# Patient Record
Sex: Male | Born: 1970 | Race: White | Hispanic: No | Marital: Married | Smoking: Former smoker
Health system: Southern US, Community
[De-identification: ages and names within clinical notes are randomized; demographics above are authoritative.]

## PROBLEM LIST (undated history)

## (undated) DIAGNOSIS — G4730 Sleep apnea, unspecified: Secondary | ICD-10-CM

## (undated) DIAGNOSIS — S069X9A Unspecified intracranial injury with loss of consciousness of unspecified duration, initial encounter: Secondary | ICD-10-CM

## (undated) HISTORY — DX: Sleep apnea, unspecified: G47.30

## (undated) HISTORY — DX: Unspecified intracranial injury with loss of consciousness of unspecified duration, initial encounter: S06.9X9A

---

## 1998-10-24 ENCOUNTER — Emergency Department (HOSPITAL_COMMUNITY): Admission: EM | Admit: 1998-10-24 | Discharge: 1998-10-24 | Payer: Self-pay | Admitting: Emergency Medicine

## 1999-03-15 ENCOUNTER — Ambulatory Visit: Admission: RE | Admit: 1999-03-15 | Discharge: 1999-03-15 | Payer: Self-pay | Admitting: Pulmonary Disease

## 2002-06-10 ENCOUNTER — Encounter: Admission: RE | Admit: 2002-06-10 | Discharge: 2002-06-10 | Payer: Self-pay | Admitting: Orthopedic Surgery

## 2002-06-10 ENCOUNTER — Encounter: Payer: Self-pay | Admitting: Orthopedic Surgery

## 2002-11-17 ENCOUNTER — Encounter: Payer: Self-pay | Admitting: Orthopedic Surgery

## 2002-11-17 ENCOUNTER — Encounter: Admission: RE | Admit: 2002-11-17 | Discharge: 2002-11-17 | Payer: Self-pay | Admitting: Orthopedic Surgery

## 2009-04-03 HISTORY — PX: APPENDECTOMY: SHX54

## 2009-07-04 ENCOUNTER — Encounter (INDEPENDENT_AMBULATORY_CARE_PROVIDER_SITE_OTHER): Payer: Self-pay | Admitting: General Surgery

## 2009-07-04 ENCOUNTER — Inpatient Hospital Stay (HOSPITAL_COMMUNITY)
Admission: EM | Admit: 2009-07-04 | Discharge: 2009-07-06 | Payer: Self-pay | Source: Home / Self Care | Admitting: Emergency Medicine

## 2010-03-03 DIAGNOSIS — S069X9A Unspecified intracranial injury with loss of consciousness of unspecified duration, initial encounter: Secondary | ICD-10-CM

## 2010-03-03 DIAGNOSIS — S069XAA Unspecified intracranial injury with loss of consciousness status unknown, initial encounter: Secondary | ICD-10-CM

## 2010-03-03 HISTORY — DX: Unspecified intracranial injury with loss of consciousness status unknown, initial encounter: S06.9XAA

## 2010-03-03 HISTORY — DX: Unspecified intracranial injury with loss of consciousness of unspecified duration, initial encounter: S06.9X9A

## 2010-03-12 ENCOUNTER — Inpatient Hospital Stay (HOSPITAL_COMMUNITY)
Admission: AC | Admit: 2010-03-12 | Discharge: 2010-03-23 | Disposition: A | Discharge status: Rehabilitation Facility | Payer: Self-pay | Source: Home / Self Care | Attending: General Surgery | Admitting: General Surgery

## 2010-03-23 ENCOUNTER — Inpatient Hospital Stay (HOSPITAL_COMMUNITY)
Admission: RE | Admit: 2010-03-23 | Discharge: 2010-04-29 | Payer: Self-pay | Attending: Physical Medicine & Rehabilitation | Admitting: Physical Medicine & Rehabilitation

## 2010-04-03 HISTORY — PX: KNEE CARTILAGE SURGERY: SHX688

## 2010-04-03 HISTORY — PX: ANKLE SURGERY: SHX546

## 2010-04-28 ENCOUNTER — Other Ambulatory Visit: Payer: Self-pay | Admitting: Neurosurgery

## 2010-04-28 DIAGNOSIS — F329 Major depressive disorder, single episode, unspecified: Secondary | ICD-10-CM

## 2010-04-28 DIAGNOSIS — S069XAA Unspecified intracranial injury with loss of consciousness status unknown, initial encounter: Secondary | ICD-10-CM

## 2010-04-28 DIAGNOSIS — F3289 Other specified depressive episodes: Secondary | ICD-10-CM

## 2010-04-28 DIAGNOSIS — S069X9A Unspecified intracranial injury with loss of consciousness of unspecified duration, initial encounter: Secondary | ICD-10-CM

## 2010-04-28 DIAGNOSIS — F09 Unspecified mental disorder due to known physiological condition: Secondary | ICD-10-CM

## 2010-04-28 LAB — COMPREHENSIVE METABOLIC PANEL
ALT: 14 U/L (ref 0–53)
AST: 18 U/L (ref 0–37)
CO2: 26 mEq/L (ref 19–32)
Calcium: 9.2 mg/dL (ref 8.4–10.5)
Chloride: 105 mEq/L (ref 96–112)
GFR calc Af Amer: >60 mL/min (ref >60)
GFR calc non Af Amer: >60 mL/min (ref >60)
Potassium: 3.7 mEq/L (ref 3.5–5.1)
Sodium: 139 mEq/L (ref 135–145)
Total Bilirubin: 0.4 mg/dL (ref 0.3–1.2)

## 2010-04-28 LAB — CBC
Hemoglobin: 12.8 g/dL — ABNORMAL LOW (ref 13.0–17.0)
RBC: 4.25 MIL/uL (ref 4.22–5.81)
WBC: 4.7 10*3/uL (ref 4.0–10.5)
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
## 2010-04-30 LAB — CARBAMAZEPINE, FREE AND TOTAL: Carbamazepine, Total: 3.9 ug/mL — ABNORMAL LOW (ref 4.0–12.0)

## 2010-05-02 ENCOUNTER — Encounter
Admission: RE | Admit: 2010-05-02 | Discharge: 2010-05-03 | Payer: Self-pay | Source: Home / Self Care | Attending: Physical Medicine & Rehabilitation | Admitting: Physical Medicine & Rehabilitation

## 2010-05-12 ENCOUNTER — Ambulatory Visit: Payer: PRIVATE HEALTH INSURANCE | Attending: Physical Medicine & Rehabilitation | Admitting: Physical Therapy

## 2010-05-12 ENCOUNTER — Ambulatory Visit: Payer: PRIVATE HEALTH INSURANCE

## 2010-05-12 ENCOUNTER — Ambulatory Visit: Payer: PRIVATE HEALTH INSURANCE | Admitting: Occupational Therapy

## 2010-05-12 DIAGNOSIS — R5381 Other malaise: Secondary | ICD-10-CM | POA: Insufficient documentation

## 2010-05-12 DIAGNOSIS — R269 Unspecified abnormalities of gait and mobility: Secondary | ICD-10-CM | POA: Insufficient documentation

## 2010-05-12 DIAGNOSIS — M6281 Muscle weakness (generalized): Secondary | ICD-10-CM | POA: Insufficient documentation

## 2010-05-12 DIAGNOSIS — R41844 Frontal lobe and executive function deficit: Secondary | ICD-10-CM | POA: Insufficient documentation

## 2010-05-12 DIAGNOSIS — R4189 Other symptoms and signs involving cognitive functions and awareness: Secondary | ICD-10-CM | POA: Insufficient documentation

## 2010-05-12 DIAGNOSIS — Z5189 Encounter for other specified aftercare: Secondary | ICD-10-CM | POA: Insufficient documentation

## 2010-05-16 ENCOUNTER — Encounter: Payer: PRIVATE HEALTH INSURANCE | Attending: Physical Medicine & Rehabilitation

## 2010-05-16 DIAGNOSIS — X58XXXA Exposure to other specified factors, initial encounter: Secondary | ICD-10-CM | POA: Insufficient documentation

## 2010-05-16 DIAGNOSIS — S8253XA Displaced fracture of medial malleolus of unspecified tibia, initial encounter for closed fracture: Secondary | ICD-10-CM | POA: Insufficient documentation

## 2010-05-16 DIAGNOSIS — S22009A Unspecified fracture of unspecified thoracic vertebra, initial encounter for closed fracture: Secondary | ICD-10-CM | POA: Insufficient documentation

## 2010-05-16 DIAGNOSIS — S32009A Unspecified fracture of unspecified lumbar vertebra, initial encounter for closed fracture: Secondary | ICD-10-CM | POA: Insufficient documentation

## 2010-05-16 DIAGNOSIS — F070 Personality change due to known physiological condition: Secondary | ICD-10-CM | POA: Insufficient documentation

## 2010-05-16 DIAGNOSIS — M549 Dorsalgia, unspecified: Secondary | ICD-10-CM | POA: Insufficient documentation

## 2010-05-16 DIAGNOSIS — S069X0A Unspecified intracranial injury without loss of consciousness, initial encounter: Secondary | ICD-10-CM | POA: Insufficient documentation

## 2010-05-17 ENCOUNTER — Inpatient Hospital Stay: Payer: PRIVATE HEALTH INSURANCE | Admitting: Physical Medicine & Rehabilitation

## 2010-05-17 DIAGNOSIS — F070 Personality change due to known physiological condition: Secondary | ICD-10-CM

## 2010-05-17 DIAGNOSIS — S8253XA Displaced fracture of medial malleolus of unspecified tibia, initial encounter for closed fracture: Secondary | ICD-10-CM

## 2010-05-17 DIAGNOSIS — S069X9A Unspecified intracranial injury with loss of consciousness of unspecified duration, initial encounter: Secondary | ICD-10-CM

## 2010-05-17 DIAGNOSIS — S32009A Unspecified fracture of unspecified lumbar vertebra, initial encounter for closed fracture: Secondary | ICD-10-CM
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
## 2010-05-18 ENCOUNTER — Ambulatory Visit: Payer: PRIVATE HEALTH INSURANCE | Admitting: Occupational Therapy

## 2010-05-18 ENCOUNTER — Ambulatory Visit: Payer: PRIVATE HEALTH INSURANCE | Admitting: Physical Therapy

## 2010-05-18 ENCOUNTER — Inpatient Hospital Stay: Payer: Self-pay | Admitting: Physical Medicine & Rehabilitation

## 2010-05-18 ENCOUNTER — Ambulatory Visit: Payer: PRIVATE HEALTH INSURANCE | Admitting: Speech Pathology

## 2010-05-20 ENCOUNTER — Ambulatory Visit: Payer: PRIVATE HEALTH INSURANCE | Admitting: Occupational Therapy

## 2010-05-20 ENCOUNTER — Ambulatory Visit: Payer: PRIVATE HEALTH INSURANCE

## 2010-05-20 ENCOUNTER — Ambulatory Visit: Payer: PRIVATE HEALTH INSURANCE | Admitting: Physical Therapy

## 2010-05-23 ENCOUNTER — Ambulatory Visit: Payer: PRIVATE HEALTH INSURANCE | Attending: Physical Medicine & Rehabilitation

## 2010-05-23 ENCOUNTER — Ambulatory Visit: Payer: Self-pay | Admitting: Physical Therapy

## 2010-05-23 ENCOUNTER — Encounter: Payer: Self-pay | Admitting: Occupational Therapy

## 2010-05-23 DIAGNOSIS — R41844 Frontal lobe and executive function deficit: Secondary | ICD-10-CM | POA: Insufficient documentation

## 2010-05-23 DIAGNOSIS — IMO0001 Reserved for inherently not codable concepts without codable children: Secondary | ICD-10-CM | POA: Insufficient documentation

## 2010-05-23 DIAGNOSIS — F4320 Adjustment disorder, unspecified: Secondary | ICD-10-CM

## 2010-05-23 DIAGNOSIS — S069X9A Unspecified intracranial injury with loss of consciousness of unspecified duration, initial encounter: Secondary | ICD-10-CM

## 2010-05-23 DIAGNOSIS — F070 Personality change due to known physiological condition: Secondary | ICD-10-CM

## 2010-05-23 DIAGNOSIS — R4189 Other symptoms and signs involving cognitive functions and awareness: Secondary | ICD-10-CM | POA: Insufficient documentation

## 2010-05-26 ENCOUNTER — Ambulatory Visit: Payer: PRIVATE HEALTH INSURANCE | Admitting: Occupational Therapy

## 2010-05-26 ENCOUNTER — Ambulatory Visit: Payer: PRIVATE HEALTH INSURANCE | Admitting: Physical Therapy

## 2010-05-27 ENCOUNTER — Encounter: Payer: Self-pay | Admitting: Occupational Therapy

## 2010-05-27 ENCOUNTER — Ambulatory Visit: Payer: Self-pay | Admitting: Physical Therapy

## 2010-05-30 ENCOUNTER — Ambulatory Visit: Payer: PRIVATE HEALTH INSURANCE | Admitting: Physical Therapy

## 2010-05-30 ENCOUNTER — Ambulatory Visit: Payer: PRIVATE HEALTH INSURANCE | Admitting: Occupational Therapy

## 2010-05-30 ENCOUNTER — Ambulatory Visit: Payer: PRIVATE HEALTH INSURANCE | Admitting: Speech Pathology

## 2010-05-30 DIAGNOSIS — F070 Personality change due to known physiological condition: Secondary | ICD-10-CM

## 2010-05-30 DIAGNOSIS — F4320 Adjustment disorder, unspecified: Secondary | ICD-10-CM

## 2010-05-30 DIAGNOSIS — S069X9A Unspecified intracranial injury with loss of consciousness of unspecified duration, initial encounter: Secondary | ICD-10-CM

## 2010-06-01 ENCOUNTER — Ambulatory Visit: Payer: PRIVATE HEALTH INSURANCE

## 2010-06-01 ENCOUNTER — Ambulatory Visit: Payer: PRIVATE HEALTH INSURANCE | Admitting: Physical Therapy

## 2010-06-01 ENCOUNTER — Ambulatory Visit: Payer: PRIVATE HEALTH INSURANCE | Admitting: Occupational Therapy

## 2010-06-01 ENCOUNTER — Encounter: Payer: Self-pay | Admitting: Speech Pathology

## 2010-06-02 ENCOUNTER — Other Ambulatory Visit: Payer: Self-pay | Admitting: Neurosurgery

## 2010-06-06 ENCOUNTER — Ambulatory Visit: Payer: PRIVATE HEALTH INSURANCE | Admitting: Physical Therapy

## 2010-06-06 ENCOUNTER — Ambulatory Visit: Payer: Self-pay | Admitting: Physical Therapy

## 2010-06-06 ENCOUNTER — Encounter: Payer: Self-pay | Admitting: Speech Pathology

## 2010-06-06 ENCOUNTER — Encounter: Payer: Self-pay | Admitting: Occupational Therapy

## 2010-06-06 ENCOUNTER — Ambulatory Visit: Payer: PRIVATE HEALTH INSURANCE | Attending: Physical Medicine & Rehabilitation | Admitting: Occupational Therapy

## 2010-06-06 ENCOUNTER — Ambulatory Visit: Payer: PRIVATE HEALTH INSURANCE

## 2010-06-06 DIAGNOSIS — R4189 Other symptoms and signs involving cognitive functions and awareness: Secondary | ICD-10-CM | POA: Insufficient documentation

## 2010-06-06 DIAGNOSIS — IMO0001 Reserved for inherently not codable concepts without codable children: Secondary | ICD-10-CM | POA: Insufficient documentation

## 2010-06-06 DIAGNOSIS — R41844 Frontal lobe and executive function deficit: Secondary | ICD-10-CM | POA: Insufficient documentation

## 2010-06-08 ENCOUNTER — Ambulatory Visit: Payer: PRIVATE HEALTH INSURANCE | Admitting: Physical Therapy

## 2010-06-08 ENCOUNTER — Ambulatory Visit: Payer: PRIVATE HEALTH INSURANCE | Admitting: Occupational Therapy

## 2010-06-08 ENCOUNTER — Ambulatory Visit: Payer: PRIVATE HEALTH INSURANCE | Admitting: Speech Pathology

## 2010-06-08 DIAGNOSIS — S069X9A Unspecified intracranial injury with loss of consciousness of unspecified duration, initial encounter: Secondary | ICD-10-CM

## 2010-06-08 DIAGNOSIS — F4320 Adjustment disorder, unspecified: Secondary | ICD-10-CM

## 2010-06-08 DIAGNOSIS — F070 Personality change due to known physiological condition: Secondary | ICD-10-CM

## 2010-06-13 ENCOUNTER — Ambulatory Visit: Payer: PRIVATE HEALTH INSURANCE | Admitting: Occupational Therapy

## 2010-06-13 ENCOUNTER — Ambulatory Visit: Payer: Self-pay | Admitting: Physical Therapy

## 2010-06-13 ENCOUNTER — Ambulatory Visit: Payer: PRIVATE HEALTH INSURANCE

## 2010-06-13 ENCOUNTER — Encounter: Payer: Self-pay | Admitting: Occupational Therapy

## 2010-06-13 ENCOUNTER — Encounter: Payer: Self-pay | Admitting: Speech Pathology

## 2010-06-13 ENCOUNTER — Ambulatory Visit: Payer: PRIVATE HEALTH INSURANCE | Admitting: Physical Therapy

## 2010-06-13 LAB — URINE MICROSCOPIC-ADD ON

## 2010-06-13 LAB — URINE CULTURE
Colony Count: NO GROWTH
Culture  Setup Time: 201112212226
Culture  Setup Time: 201112271312
Culture: NO GROWTH

## 2010-06-13 LAB — COMPREHENSIVE METABOLIC PANEL
ALT: 90 U/L — ABNORMAL HIGH (ref 0–53)
ALT: 59 U/L — ABNORMAL HIGH (ref 0–53)
AST: 40 U/L — ABNORMAL HIGH (ref 0–37)
Albumin: 3.6 g/dL (ref 3.5–5.2)
BUN: 16 mg/dL (ref 6–23)
CO2: 27 mEq/L (ref 19–32)
Calcium: 9.2 mg/dL (ref 8.4–10.5)
Calcium: 9.4 mg/dL (ref 8.4–10.5)
Chloride: 102 mEq/L (ref 96–112)
Creatinine, Ser: 0.77 mg/dL (ref 0.4–1.5)
Creatinine, Ser: 0.83 mg/dL (ref 0.4–1.5)
GFR calc Af Amer: >60 mL/min (ref >60)
GFR calc non Af Amer: >60 mL/min (ref >60)
Glucose, Bld: 111 mg/dL — ABNORMAL HIGH (ref 70–99)
Sodium: 139 mEq/L (ref 135–145)
Sodium: 138 mEq/L (ref 135–145)
Total Bilirubin: 0.8 mg/dL (ref 0.3–1.2)
Total Protein: 6.8 g/dL (ref 6.0–8.3)

## 2010-06-13 LAB — CBC
HCT: 31.4 % — ABNORMAL LOW (ref 39.0–52.0)
HCT: 31.4 % — ABNORMAL LOW (ref 39.0–52.0)
Hemoglobin: 10 g/dL — ABNORMAL LOW (ref 13.0–17.0)
Hemoglobin: 9.4 g/dL — ABNORMAL LOW (ref 13.0–17.0)
Hemoglobin: 11.7 g/dL — ABNORMAL LOW (ref 13.0–17.0)
MCH: 31.2 pg (ref 26.0–34.0)
MCH: 31.2 pg (ref 26.0–34.0)
MCH: 31.7 pg (ref 26.0–34.0)
MCH: 32.4 pg (ref 26.0–34.0)
MCH: 31.5 pg (ref 26.0–34.0)
MCHC: 35.4 g/dL (ref 30.0–36.0)
MCV: 87.5 fL (ref 78.0–100.0)
MCV: 88.2 fL (ref 78.0–100.0)
Platelets: 267 10*3/uL (ref 150–400)
Platelets: 371 10*3/uL (ref 150–400)
RBC: 2.9 MIL/uL — ABNORMAL LOW (ref 4.22–5.81)
RBC: 3.21 MIL/uL — ABNORMAL LOW (ref 4.22–5.81)
RBC: 3.56 MIL/uL — ABNORMAL LOW (ref 4.22–5.81)
RBC: 3.72 MIL/uL — ABNORMAL LOW (ref 4.22–5.81)
RDW: 12.3 % (ref 11.5–15.5)
RDW: 12.3 % (ref 11.5–15.5)

## 2010-06-13 LAB — URINALYSIS, ROUTINE W REFLEX MICROSCOPIC
Bilirubin Urine: NEGATIVE
Bilirubin Urine: NEGATIVE
Glucose, UA: NEGATIVE mg/dL
Hgb urine dipstick: NEGATIVE
Ketones, ur: NEGATIVE mg/dL
Ketones, ur: NEGATIVE mg/dL
Nitrite: NEGATIVE
Nitrite: NEGATIVE
Nitrite: NEGATIVE
Protein, ur: NEGATIVE mg/dL
Protein, ur: NEGATIVE mg/dL
Specific Gravity, Urine: 1.019 (ref 1.005–1.030)
Urobilinogen, UA: 0.2 mg/dL (ref 0.0–1.0)
Urobilinogen, UA: 1 mg/dL (ref 0.0–1.0)
pH: 7 (ref 5.0–8.0)
pH: 7.5 (ref 5.0–8.0)

## 2010-06-13 LAB — BASIC METABOLIC PANEL
BUN: 17 mg/dL (ref 6–23)
CO2: 25 mEq/L (ref 19–32)
CO2: 26 mEq/L (ref 19–32)
CO2: 26 mEq/L (ref 19–32)
Calcium: 8.3 mg/dL — ABNORMAL LOW (ref 8.4–10.5)
Chloride: 106 mEq/L (ref 96–112)
Chloride: 108 mEq/L (ref 96–112)
Creatinine, Ser: 0.76 mg/dL (ref 0.4–1.5)
GFR calc Af Amer: >60 mL/min (ref >60)
GFR calc Af Amer: >60 mL/min (ref >60)
GFR calc non Af Amer: >60 mL/min (ref >60)
Potassium: 3.8 mEq/L (ref 3.5–5.1)
Potassium: 3.9 mEq/L (ref 3.5–5.1)
Sodium: 140 mEq/L (ref 135–145)
Sodium: 140 mEq/L (ref 135–145)

## 2010-06-13 LAB — DIFFERENTIAL
Eosinophils Absolute: 0.3 10*3/uL (ref 0.0–0.7)
Eosinophils Relative: 4 % (ref 0–5)
Lymphocytes Relative: 16 % (ref 12–46)
Lymphocytes Relative: 24 % (ref 12–46)
Lymphs Abs: 1.2 10*3/uL (ref 0.7–4.0)
Lymphs Abs: 2.3 10*3/uL (ref 0.7–4.0)
Monocytes Absolute: 0.8 10*3/uL (ref 0.1–1.0)
Monocytes Relative: 8 % (ref 3–12)
Neutro Abs: 5.4 10*3/uL (ref 1.7–7.7)
Neutrophils Relative %: 72 % (ref 43–77)

## 2010-06-13 LAB — PHENYTOIN LEVEL, TOTAL
Phenytoin Lvl: 2.9 ug/mL — ABNORMAL LOW (ref 10.0–20.0)
Phenytoin Lvl: 5.6 ug/mL — ABNORMAL LOW (ref 10.0–20.0)

## 2010-06-14 ENCOUNTER — Ambulatory Visit
Admission: RE | Admit: 2010-06-14 | Discharge: 2010-06-14 | Disposition: A | Discharge status: Home / Self Care | Payer: PRIVATE HEALTH INSURANCE | Source: Ambulatory Visit | Attending: Neurosurgery | Admitting: Neurosurgery

## 2010-06-14 LAB — CBC
HCT: 24.9 % — ABNORMAL LOW (ref 39.0–52.0)
HCT: 29.1 % — ABNORMAL LOW (ref 39.0–52.0)
HCT: 41.1 % (ref 39.0–52.0)
Hemoglobin: 8.6 g/dL — ABNORMAL LOW (ref 13.0–17.0)
Hemoglobin: 8.6 g/dL — ABNORMAL LOW (ref 13.0–17.0)
MCH: 31.7 pg (ref 26.0–34.0)
MCHC: 34.5 g/dL (ref 30.0–36.0)
MCHC: 34.5 g/dL (ref 30.0–36.0)
MCHC: 35.4 g/dL (ref 30.0–36.0)
MCV: 90.1 fL (ref 78.0–100.0)
MCV: 91.5 fL (ref 78.0–100.0)
MCV: 92.2 fL (ref 78.0–100.0)
Platelets: 113 10*3/uL — ABNORMAL LOW (ref 150–400)
Platelets: 114 10*3/uL — ABNORMAL LOW (ref 150–400)
Platelets: 140 10*3/uL — ABNORMAL LOW (ref 150–400)
RBC: 4.09 MIL/uL — ABNORMAL LOW (ref 4.22–5.81)
RBC: 4.56 MIL/uL (ref 4.22–5.81)
RDW: 12.4 % (ref 11.5–15.5)
RDW: 12.6 % (ref 11.5–15.5)
RDW: 12.6 % (ref 11.5–15.5)
RDW: 12.7 % (ref 11.5–15.5)
WBC: 11.9 10*3/uL — ABNORMAL HIGH (ref 4.0–10.5)
WBC: 16.6 10*3/uL — ABNORMAL HIGH (ref 4.0–10.5)
WBC: 7.7 10*3/uL (ref 4.0–10.5)
WBC: 8.1 10*3/uL (ref 4.0–10.5)

## 2010-06-14 LAB — TYPE AND SCREEN: ABO/RH(D): O POS

## 2010-06-14 LAB — BLOOD GAS, ARTERIAL
Bicarbonate: 22.1 mEq/L (ref 20.0–24.0)
Drawn by: 31777
Mode: POSITIVE
O2 Saturation: 98.3 %
PEEP: 5 cmH2O
Patient temperature: 98.6
Patient temperature: 98.6
Pressure support: 5 cmH2O
RATE: 20 resp/min
pCO2 arterial: 36.8 mmHg (ref 35.0–45.0)
pH, Arterial: 7.423 (ref 7.350–7.450)
pH, Arterial: 7.454 — ABNORMAL HIGH (ref 7.350–7.450)

## 2010-06-14 LAB — PROTIME-INR: Prothrombin Time: 15.9 seconds — ABNORMAL HIGH (ref 11.6–15.2)

## 2010-06-14 LAB — BASIC METABOLIC PANEL
BUN: 11 mg/dL (ref 6–23)
BUN: 19 mg/dL (ref 6–23)
CO2: 25 mEq/L (ref 19–32)
Calcium: 7.9 mg/dL — ABNORMAL LOW (ref 8.4–10.5)
Calcium: 8.1 mg/dL — ABNORMAL LOW (ref 8.4–10.5)
Calcium: 8.2 mg/dL — ABNORMAL LOW (ref 8.4–10.5)
Chloride: 112 mEq/L (ref 96–112)
Chloride: 114 mEq/L — ABNORMAL HIGH (ref 96–112)
Creatinine, Ser: 0.77 mg/dL (ref 0.4–1.5)
Creatinine, Ser: 0.82 mg/dL (ref 0.4–1.5)
Creatinine, Ser: 0.99 mg/dL (ref 0.4–1.5)
GFR calc Af Amer: >60 mL/min (ref >60)
GFR calc non Af Amer: >60 mL/min (ref >60)
GFR calc non Af Amer: >60 mL/min (ref >60)
GFR calc non Af Amer: >60 mL/min (ref >60)
Glucose, Bld: 107 mg/dL — ABNORMAL HIGH (ref 70–99)
Glucose, Bld: 134 mg/dL — ABNORMAL HIGH (ref 70–99)
Glucose, Bld: 136 mg/dL — ABNORMAL HIGH (ref 70–99)
Potassium: 3.4 mEq/L — ABNORMAL LOW (ref 3.5–5.1)
Potassium: 3.5 mEq/L (ref 3.5–5.1)
Sodium: 142 mEq/L (ref 135–145)

## 2010-06-14 LAB — LACTIC ACID, PLASMA

## 2010-06-14 LAB — POCT I-STAT 3, ART BLOOD GAS (G3+)
Acid-base deficit: 4 mmol/L — ABNORMAL HIGH (ref 0.0–2.0)
O2 Saturation: 100 %
TCO2: 24 mmol/L (ref 0–100)
pO2, Arterial: 383 mmHg — ABNORMAL HIGH (ref 80.0–100.0)

## 2010-06-14 LAB — COMPREHENSIVE METABOLIC PANEL
Alkaline Phosphatase: 55 U/L (ref 39–117)
BUN: 16 mg/dL (ref 6–23)
CO2: 20 mEq/L (ref 19–32)
Chloride: 105 mEq/L (ref 96–112)
Glucose, Bld: 188 mg/dL — ABNORMAL HIGH (ref 70–99)
Potassium: 3.9 mEq/L (ref 3.5–5.1)
Total Bilirubin: 0.7 mg/dL (ref 0.3–1.2)

## 2010-06-14 LAB — URINALYSIS, ROUTINE W REFLEX MICROSCOPIC
Bilirubin Urine: NEGATIVE
Ketones, ur: NEGATIVE mg/dL
Nitrite: NEGATIVE
pH: 5.5 (ref 5.0–8.0)

## 2010-06-14 LAB — URINE MICROSCOPIC-ADD ON

## 2010-06-14 LAB — URINALYSIS, MICROSCOPIC ONLY
Bilirubin Urine: NEGATIVE
Ketones, ur: NEGATIVE mg/dL
Leukocytes, UA: NEGATIVE
Nitrite: NEGATIVE
Protein, ur: 30 mg/dL — AB
pH: 5.5 (ref 5.0–8.0)

## 2010-06-14 LAB — MRSA PCR SCREENING: MRSA by PCR: NEGATIVE

## 2010-06-14 LAB — PHENYTOIN LEVEL, TOTAL: Phenytoin Lvl: 9 ug/mL — ABNORMAL LOW (ref 10.0–20.0)

## 2010-06-14 LAB — POCT I-STAT, CHEM 8
Creatinine, Ser: 1.2 mg/dL (ref 0.4–1.5)
Glucose, Bld: 189 mg/dL — ABNORMAL HIGH (ref 70–99)
Hemoglobin: 15 g/dL (ref 13.0–17.0)
TCO2: 20 mmol/L (ref 0–100)

## 2010-06-14 LAB — HEMOGLOBIN AND HEMATOCRIT, BLOOD: Hemoglobin: 8.5 g/dL — ABNORMAL LOW (ref 13.0–17.0)

## 2010-06-15 ENCOUNTER — Ambulatory Visit: Payer: PRIVATE HEALTH INSURANCE | Admitting: Physical Therapy

## 2010-06-15 ENCOUNTER — Encounter: Payer: Self-pay | Admitting: Speech Pathology

## 2010-06-15 ENCOUNTER — Ambulatory Visit: Payer: PRIVATE HEALTH INSURANCE | Admitting: Occupational Therapy

## 2010-06-15 ENCOUNTER — Ambulatory Visit: Payer: Self-pay | Admitting: Physical Therapy

## 2010-06-15 ENCOUNTER — Ambulatory Visit: Payer: PRIVATE HEALTH INSURANCE

## 2010-06-15 ENCOUNTER — Encounter: Payer: Self-pay | Admitting: Occupational Therapy

## 2010-06-20 ENCOUNTER — Ambulatory Visit: Payer: PRIVATE HEALTH INSURANCE | Admitting: Occupational Therapy

## 2010-06-20 ENCOUNTER — Ambulatory Visit: Payer: PRIVATE HEALTH INSURANCE

## 2010-06-20 DIAGNOSIS — F070 Personality change due to known physiological condition: Secondary | ICD-10-CM

## 2010-06-20 DIAGNOSIS — S069X9A Unspecified intracranial injury with loss of consciousness of unspecified duration, initial encounter: Secondary | ICD-10-CM

## 2010-06-20 DIAGNOSIS — F4321 Adjustment disorder with depressed mood: Secondary | ICD-10-CM

## 2010-06-20 DIAGNOSIS — S069XAA Unspecified intracranial injury with loss of consciousness status unknown, initial encounter: Secondary | ICD-10-CM

## 2010-06-21 ENCOUNTER — Ambulatory Visit: Payer: PRIVATE HEALTH INSURANCE | Attending: Neurosurgery | Admitting: Physical Therapy

## 2010-06-21 DIAGNOSIS — M2560 Stiffness of unspecified joint, not elsewhere classified: Secondary | ICD-10-CM | POA: Insufficient documentation

## 2010-06-21 DIAGNOSIS — M6281 Muscle weakness (generalized): Secondary | ICD-10-CM | POA: Insufficient documentation

## 2010-06-21 DIAGNOSIS — M2550 Pain in unspecified joint: Secondary | ICD-10-CM | POA: Insufficient documentation

## 2010-06-21 DIAGNOSIS — IMO0001 Reserved for inherently not codable concepts without codable children: Secondary | ICD-10-CM | POA: Insufficient documentation

## 2010-06-21 DIAGNOSIS — R5381 Other malaise: Secondary | ICD-10-CM | POA: Insufficient documentation

## 2010-06-22 ENCOUNTER — Ambulatory Visit: Payer: PRIVATE HEALTH INSURANCE | Admitting: Physical Therapy

## 2010-06-22 ENCOUNTER — Encounter: Payer: PRIVATE HEALTH INSURANCE | Admitting: Occupational Therapy

## 2010-06-22 LAB — BASIC METABOLIC PANEL
BUN: 15 mg/dL (ref 6–23)
Chloride: 105 mEq/L (ref 96–112)
Glucose, Bld: 162 mg/dL — ABNORMAL HIGH (ref 70–99)
Potassium: 4.1 mEq/L (ref 3.5–5.1)

## 2010-06-22 LAB — DIFFERENTIAL
Basophils Absolute: 0.3 10*3/uL — ABNORMAL HIGH (ref 0.0–0.1)
Eosinophils Relative: 0 % (ref 0–5)
Lymphocytes Relative: 7 % — ABNORMAL LOW (ref 12–46)
Lymphs Abs: 1 10*3/uL (ref 0.7–4.0)
Monocytes Absolute: 0.6 10*3/uL (ref 0.1–1.0)

## 2010-06-22 LAB — CBC
HCT: 35.6 % — ABNORMAL LOW (ref 39.0–52.0)
MCHC: 34.3 g/dL (ref 30.0–36.0)
MCV: 93.9 fL (ref 78.0–100.0)
MCV: 94.5 fL (ref 78.0–100.0)
Platelets: 153 10*3/uL (ref 150–400)
Platelets: 161 10*3/uL (ref 150–400)
Platelets: 188 10*3/uL (ref 150–400)
RDW: 12.6 % (ref 11.5–15.5)
RDW: 12.7 % (ref 11.5–15.5)
WBC: 14.4 10*3/uL — ABNORMAL HIGH (ref 4.0–10.5)

## 2010-06-22 LAB — COMPREHENSIVE METABOLIC PANEL
AST: 23 U/L (ref 0–37)
Albumin: 4.2 g/dL (ref 3.5–5.2)
Chloride: 102 mEq/L (ref 96–112)
Creatinine, Ser: 1.06 mg/dL (ref 0.4–1.5)
GFR calc Af Amer: >60 mL/min (ref >60)
Sodium: 136 mEq/L (ref 135–145)
Total Bilirubin: 1 mg/dL (ref 0.3–1.2)

## 2010-06-23 ENCOUNTER — Ambulatory Visit: Payer: PRIVATE HEALTH INSURANCE

## 2010-06-23 ENCOUNTER — Ambulatory Visit: Payer: PRIVATE HEALTH INSURANCE | Admitting: Occupational Therapy

## 2010-06-24 ENCOUNTER — Encounter: Payer: PRIVATE HEALTH INSURANCE | Attending: Physical Medicine & Rehabilitation

## 2010-06-24 ENCOUNTER — Ambulatory Visit: Payer: PRIVATE HEALTH INSURANCE | Admitting: Physical Therapy

## 2010-06-24 ENCOUNTER — Ambulatory Visit: Payer: PRIVATE HEALTH INSURANCE | Admitting: Physical Medicine & Rehabilitation

## 2010-06-24 DIAGNOSIS — S8253XA Displaced fracture of medial malleolus of unspecified tibia, initial encounter for closed fracture: Secondary | ICD-10-CM | POA: Insufficient documentation

## 2010-06-24 DIAGNOSIS — S22009A Unspecified fracture of unspecified thoracic vertebra, initial encounter for closed fracture: Secondary | ICD-10-CM | POA: Insufficient documentation

## 2010-06-24 DIAGNOSIS — R079 Chest pain, unspecified: Secondary | ICD-10-CM | POA: Insufficient documentation

## 2010-06-24 DIAGNOSIS — M549 Dorsalgia, unspecified: Secondary | ICD-10-CM | POA: Insufficient documentation

## 2010-06-24 DIAGNOSIS — S32009A Unspecified fracture of unspecified lumbar vertebra, initial encounter for closed fracture: Secondary | ICD-10-CM | POA: Insufficient documentation

## 2010-06-24 DIAGNOSIS — X58XXXA Exposure to other specified factors, initial encounter: Secondary | ICD-10-CM | POA: Insufficient documentation

## 2010-06-24 DIAGNOSIS — S069X9A Unspecified intracranial injury with loss of consciousness of unspecified duration, initial encounter: Secondary | ICD-10-CM | POA: Insufficient documentation

## 2010-06-24 DIAGNOSIS — S069XAA Unspecified intracranial injury with loss of consciousness status unknown, initial encounter: Secondary | ICD-10-CM | POA: Insufficient documentation

## 2010-06-24 DIAGNOSIS — M5380 Other specified dorsopathies, site unspecified: Secondary | ICD-10-CM | POA: Insufficient documentation

## 2010-06-24 DIAGNOSIS — F070 Personality change due to known physiological condition: Secondary | ICD-10-CM | POA: Insufficient documentation

## 2010-06-24 DIAGNOSIS — M545 Low back pain, unspecified: Secondary | ICD-10-CM | POA: Insufficient documentation

## 2010-06-27 ENCOUNTER — Encounter: Payer: PRIVATE HEALTH INSURANCE | Admitting: Occupational Therapy

## 2010-06-27 ENCOUNTER — Ambulatory Visit: Payer: PRIVATE HEALTH INSURANCE | Admitting: Occupational Therapy

## 2010-06-27 DIAGNOSIS — F070 Personality change due to known physiological condition: Secondary | ICD-10-CM

## 2010-06-27 DIAGNOSIS — F4321 Adjustment disorder with depressed mood: Secondary | ICD-10-CM

## 2010-06-27 DIAGNOSIS — S069X9A Unspecified intracranial injury with loss of consciousness of unspecified duration, initial encounter: Secondary | ICD-10-CM

## 2010-06-28 ENCOUNTER — Ambulatory Visit: Payer: PRIVATE HEALTH INSURANCE | Admitting: Physical Therapy

## 2010-06-29 ENCOUNTER — Ambulatory Visit: Payer: PRIVATE HEALTH INSURANCE | Admitting: Occupational Therapy

## 2010-06-29 ENCOUNTER — Ambulatory Visit: Payer: PRIVATE HEALTH INSURANCE | Attending: Neurosurgery | Admitting: Physical Therapy

## 2010-06-29 ENCOUNTER — Ambulatory Visit: Payer: PRIVATE HEALTH INSURANCE

## 2010-06-29 DIAGNOSIS — IMO0001 Reserved for inherently not codable concepts without codable children: Secondary | ICD-10-CM | POA: Insufficient documentation

## 2010-06-29 DIAGNOSIS — M2550 Pain in unspecified joint: Secondary | ICD-10-CM | POA: Insufficient documentation

## 2010-06-29 DIAGNOSIS — R5381 Other malaise: Secondary | ICD-10-CM | POA: Insufficient documentation

## 2010-06-29 DIAGNOSIS — M2560 Stiffness of unspecified joint, not elsewhere classified: Secondary | ICD-10-CM | POA: Insufficient documentation

## 2010-06-29 DIAGNOSIS — M6281 Muscle weakness (generalized): Secondary | ICD-10-CM | POA: Insufficient documentation

## 2010-07-01 ENCOUNTER — Ambulatory Visit: Payer: PRIVATE HEALTH INSURANCE | Admitting: Physical Therapy

## 2010-07-04 ENCOUNTER — Ambulatory Visit: Payer: PRIVATE HEALTH INSURANCE | Attending: Neurosurgery | Admitting: Physical Therapy

## 2010-07-04 ENCOUNTER — Encounter: Payer: PRIVATE HEALTH INSURANCE | Admitting: Occupational Therapy

## 2010-07-04 DIAGNOSIS — IMO0001 Reserved for inherently not codable concepts without codable children: Secondary | ICD-10-CM | POA: Insufficient documentation

## 2010-07-04 DIAGNOSIS — M2550 Pain in unspecified joint: Secondary | ICD-10-CM | POA: Insufficient documentation

## 2010-07-04 DIAGNOSIS — M2560 Stiffness of unspecified joint, not elsewhere classified: Secondary | ICD-10-CM | POA: Insufficient documentation

## 2010-07-04 DIAGNOSIS — R5381 Other malaise: Secondary | ICD-10-CM | POA: Insufficient documentation

## 2010-07-04 DIAGNOSIS — M6281 Muscle weakness (generalized): Secondary | ICD-10-CM | POA: Insufficient documentation

## 2010-07-06 ENCOUNTER — Ambulatory Visit: Payer: PRIVATE HEALTH INSURANCE | Admitting: Physical Therapy

## 2010-07-07 ENCOUNTER — Ambulatory Visit: Payer: PRIVATE HEALTH INSURANCE | Admitting: Physical Therapy

## 2010-07-07 ENCOUNTER — Encounter: Payer: PRIVATE HEALTH INSURANCE | Admitting: Occupational Therapy

## 2010-07-11 ENCOUNTER — Encounter: Payer: PRIVATE HEALTH INSURANCE | Admitting: Physical Therapy

## 2010-07-13 ENCOUNTER — Ambulatory Visit: Payer: PRIVATE HEALTH INSURANCE | Admitting: Physical Therapy

## 2010-07-14 ENCOUNTER — Ambulatory Visit: Payer: PRIVATE HEALTH INSURANCE | Admitting: Physical Therapy

## 2010-07-18 ENCOUNTER — Ambulatory Visit: Payer: PRIVATE HEALTH INSURANCE | Admitting: Physical Therapy

## 2010-07-19 DIAGNOSIS — S069X9A Unspecified intracranial injury with loss of consciousness of unspecified duration, initial encounter: Secondary | ICD-10-CM

## 2010-07-19 DIAGNOSIS — F4321 Adjustment disorder with depressed mood: Secondary | ICD-10-CM

## 2010-07-19 DIAGNOSIS — R413 Other amnesia: Secondary | ICD-10-CM

## 2010-07-20 ENCOUNTER — Ambulatory Visit: Payer: PRIVATE HEALTH INSURANCE | Admitting: Physical Therapy

## 2010-07-22 ENCOUNTER — Ambulatory Visit: Payer: PRIVATE HEALTH INSURANCE | Admitting: Physical Therapy

## 2010-07-25 ENCOUNTER — Encounter: Payer: PRIVATE HEALTH INSURANCE | Admitting: Physical Therapy

## 2010-07-25 DIAGNOSIS — S069X9A Unspecified intracranial injury with loss of consciousness of unspecified duration, initial encounter: Secondary | ICD-10-CM

## 2010-07-25 DIAGNOSIS — F0789 Other personality and behavioral disorders due to known physiological condition: Secondary | ICD-10-CM

## 2010-07-25 DIAGNOSIS — F4321 Adjustment disorder with depressed mood: Secondary | ICD-10-CM

## 2010-07-27 ENCOUNTER — Ambulatory Visit: Payer: PRIVATE HEALTH INSURANCE | Admitting: Physical Therapy

## 2010-07-29 ENCOUNTER — Encounter: Payer: PRIVATE HEALTH INSURANCE | Admitting: Physical Therapy

## 2010-07-29 DIAGNOSIS — F0789 Other personality and behavioral disorders due to known physiological condition: Secondary | ICD-10-CM

## 2010-07-29 DIAGNOSIS — S069X9A Unspecified intracranial injury with loss of consciousness of unspecified duration, initial encounter: Secondary | ICD-10-CM

## 2010-07-29 DIAGNOSIS — F4321 Adjustment disorder with depressed mood: Secondary | ICD-10-CM

## 2010-08-01 ENCOUNTER — Ambulatory Visit: Payer: PRIVATE HEALTH INSURANCE | Admitting: Physical Therapy

## 2010-08-01 NOTE — Assessment & Plan Note (Signed)
HISTORY OF PRESENT ILLNESS:  Martin Fitzgerald is back regarding his traumatic brain injury.  He continues to make progress.  He is getting his outpatient cognitive and occupational therapies.  He is going to orthopedic therapy for his ankle and back.  He has some pain still in the back and along the rib cage.  It is likely that he suffered a rib fracture that was not seen on workup.  He was taken out of his ASO on the right leg and given full clearance for activity by Orthopedics.  Pain ranged from a 2 to 5/10 and most prominent is at the knee as well as the right rib cage and low back.  Behavior is improved.  He is now off Risperdal and Tegretol as well as Klonopin and using the propranolol and only.  He is off the Tegretol also.  He does have some occasional moments where he is tearful and anxious but overall is doing much better with organization skills and chores around the house.  He still does get sidetracked occasionally, he has problems with attention, focus, organization.  Mood stability overall has much improved.  Overall, he is still sometimes stuck in mid of a groove and wonders what the purpose of repeating the schedule everyday.  He is anxious to get back to work and is frustrated that he is not where he needs to be as yet.  He meets with Dr. Leonides Cave in the next few weeks for neuropsychological testing.  He is getting some weekly counseling already.  REVIEW OF SYSTEMS:  Notable for occasional low back spasms, poor appetite.  Full 12-point review is in the written health and history section of the chart.  SOCIAL HISTORY:  The patient is married living with his wife and 2 children.  PHYSICAL EXAMINATION:  VITAL SIGNS:  Blood pressure is 115/72, pulse 70, respiratory rate 18.  He is satting 99% on room air. NEUROLOGICAL:  The patient is pleasant, alert, oriented x3.  He has better insight and awareness today.  His focus and conversation is much more concise today.  Really no  perseveration was seen.  Balance is excellent. MUSCULOSKELETAL:  He does have poor posture as he has almost kyphotic curve in the thoracic and the lower cervical spine.  With cuing and stretching, he is able to improve the posture quite a bit.  He has some tenderness in the low back with palpation, although I felt no spasm there today.  I did have knot which likely is a sclerosed bone with a lower angle of the ribs perhaps at the 10th rib level.  Strength is 5/5. Sensory exam is intact.  ASSESSMENT: 1. Severe traumatic brain injury. 2. Right medial malleolus fracture. 3. T2-L1, L2-L3 and L4 compression fractures. 4. Frontal lobe syndrome.  PLAN: 1. I asked Martin Fitzgerald to work on setting daily and weekly goals as well as     monthly goals for now.  I am sure Martin Fitzgerald at speech therapy can help     him with this as well as Dr. Leonides Cave.  He needs to challenge himself     on a daily basis to show improvement in memory, organization and     higher level cognitive skills. 2. We will stay with the Inderal 20 mg daily for now.  I do not     believe anything is needed for depression at this point. 3. I do believe he needs to be on the Ritalin for attention 20 mg     daily at  8:00 a.m. and 2:00 p.m. 4. He is not allowed to drive at this time, but I think perhaps over     the next 2 months, he might be appropriate for local driving. 5. Neuropsychological testing next month. 6. Return to work in 4-6 months' time frame.  Neuropsych testing will     give [place] some insight in this regard. 7. I will see Martin Fitzgerald back in about 2 months time.     Ranelle Oyster, M.D. Electronically Signed    ZTS/MedQ D:  06/24/2010 13:45:28  T:  06/24/2010 23:49:40  Job #:  [id number]

## 2010-08-03 ENCOUNTER — Ambulatory Visit: Payer: PRIVATE HEALTH INSURANCE | Admitting: Physical Therapy

## 2010-08-03 DIAGNOSIS — S069X9A Unspecified intracranial injury with loss of consciousness of unspecified duration, initial encounter: Secondary | ICD-10-CM

## 2010-08-03 DIAGNOSIS — R5381 Other malaise: Secondary | ICD-10-CM | POA: Insufficient documentation

## 2010-08-03 DIAGNOSIS — M2550 Pain in unspecified joint: Secondary | ICD-10-CM | POA: Insufficient documentation

## 2010-08-03 DIAGNOSIS — IMO0001 Reserved for inherently not codable concepts without codable children: Secondary | ICD-10-CM | POA: Insufficient documentation

## 2010-08-03 DIAGNOSIS — F064 Anxiety disorder due to known physiological condition: Secondary | ICD-10-CM

## 2010-08-03 DIAGNOSIS — M6281 Muscle weakness (generalized): Secondary | ICD-10-CM | POA: Insufficient documentation

## 2010-08-03 DIAGNOSIS — M2560 Stiffness of unspecified joint, not elsewhere classified: Secondary | ICD-10-CM | POA: Insufficient documentation

## 2010-08-05 ENCOUNTER — Ambulatory Visit: Payer: PRIVATE HEALTH INSURANCE | Admitting: Physical Therapy

## 2010-08-05 ENCOUNTER — Ambulatory Visit: Payer: PRIVATE HEALTH INSURANCE | Admitting: Physical Medicine & Rehabilitation

## 2010-08-05 ENCOUNTER — Encounter: Payer: PRIVATE HEALTH INSURANCE | Attending: Neurosurgery | Admitting: Neurosurgery

## 2010-08-05 DIAGNOSIS — S8290XS Unspecified fracture of unspecified lower leg, sequela: Secondary | ICD-10-CM | POA: Insufficient documentation

## 2010-08-05 DIAGNOSIS — S069XAS Unspecified intracranial injury with loss of consciousness status unknown, sequela: Secondary | ICD-10-CM | POA: Insufficient documentation

## 2010-08-05 DIAGNOSIS — S069X9S Unspecified intracranial injury with loss of consciousness of unspecified duration, sequela: Secondary | ICD-10-CM | POA: Insufficient documentation

## 2010-08-05 DIAGNOSIS — IMO0002 Reserved for concepts with insufficient information to code with codable children: Secondary | ICD-10-CM | POA: Insufficient documentation

## 2010-08-05 DIAGNOSIS — M25569 Pain in unspecified knee: Secondary | ICD-10-CM | POA: Insufficient documentation

## 2010-08-05 DIAGNOSIS — F070 Personality change due to known physiological condition: Secondary | ICD-10-CM | POA: Insufficient documentation

## 2010-08-08 ENCOUNTER — Ambulatory Visit: Payer: PRIVATE HEALTH INSURANCE | Admitting: Physical Therapy
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
## 2010-08-10 ENCOUNTER — Ambulatory Visit: Payer: PRIVATE HEALTH INSURANCE | Admitting: Physical Therapy

## 2010-08-11 DIAGNOSIS — F4321 Adjustment disorder with depressed mood: Secondary | ICD-10-CM

## 2010-08-11 DIAGNOSIS — F0789 Other personality and behavioral disorders due to known physiological condition: Secondary | ICD-10-CM

## 2010-08-11 DIAGNOSIS — S069X9A Unspecified intracranial injury with loss of consciousness of unspecified duration, initial encounter: Secondary | ICD-10-CM

## 2010-08-12 ENCOUNTER — Ambulatory Visit: Payer: PRIVATE HEALTH INSURANCE | Admitting: Physical Therapy

## 2010-08-15 ENCOUNTER — Encounter
Payer: PRIVATE HEALTH INSURANCE | Attending: Physical Medicine & Rehabilitation | Admitting: Physical Medicine & Rehabilitation

## 2010-08-15 DIAGNOSIS — S069X9A Unspecified intracranial injury with loss of consciousness of unspecified duration, initial encounter: Secondary | ICD-10-CM

## 2010-08-15 DIAGNOSIS — S32009A Unspecified fracture of unspecified lumbar vertebra, initial encounter for closed fracture: Secondary | ICD-10-CM

## 2010-08-15 DIAGNOSIS — S069XAS Unspecified intracranial injury with loss of consciousness status unknown, sequela: Secondary | ICD-10-CM | POA: Insufficient documentation

## 2010-08-15 DIAGNOSIS — X58XXXS Exposure to other specified factors, sequela: Secondary | ICD-10-CM | POA: Insufficient documentation

## 2010-08-15 DIAGNOSIS — F070 Personality change due to known physiological condition: Secondary | ICD-10-CM | POA: Insufficient documentation

## 2010-08-15 DIAGNOSIS — M25569 Pain in unspecified knee: Secondary | ICD-10-CM | POA: Insufficient documentation

## 2010-08-15 DIAGNOSIS — S8290XS Unspecified fracture of unspecified lower leg, sequela: Secondary | ICD-10-CM | POA: Insufficient documentation

## 2010-08-15 DIAGNOSIS — S8253XA Displaced fracture of medial malleolus of unspecified tibia, initial encounter for closed fracture: Secondary | ICD-10-CM

## 2010-08-15 DIAGNOSIS — S069X9S Unspecified intracranial injury with loss of consciousness of unspecified duration, sequela: Secondary | ICD-10-CM | POA: Insufficient documentation

## 2010-08-16 ENCOUNTER — Ambulatory Visit: Payer: PRIVATE HEALTH INSURANCE | Admitting: Physical Therapy

## 2010-08-16 NOTE — Assessment & Plan Note (Signed)
Martin Fitzgerald is back regarding traumatic brain injury.  He has seen Dr. Eula Flax and reviewed his report.  I have also seen Martin Fitzgerald earlier.  He is ready to back to work.  He does have some reservations and knows that he will have some limitations both in his communication skills, memory, endurance, etc.  He has been working in the same position for 6 years and 8 years with the company.  He is working in [employer] for a [employer].  He recounted much of the details of his duty to me today.  He is off of his medication now.  From a pain standpoint, reports some pain in the right knee with exercise.  He has a problem trying to jog or bike.  He is doing some biking and doing a 7-mile ride later this week he tells me.  REVIEW OF SYSTEMS:  Notable for the above.  Full 12-point review is in the written health and history section of the chart.  SOCIAL HISTORY:  Unchanged.  Wife is married and she has returned to work.  He has been accountable for the kids while she has been back at work and seems to be comfortable with that.  PHYSICAL EXAMINATION:  Blood pressure 123/61, pulse 69, respiratory rate 18.  He is satting 100% on room air.  The patient is pleasant, alert, and oriented x3.  No perseveration seen today.  He occasionally takes some extra time to gather himself.  He has good awareness and insight. Memory still occasionally is an issue for short-term facts.  Gait is stable.  He had some mild right knee pain, but no significant crepitus. Meniscal signs are negative.  No instability was seen.  Strength seemed to be 5/5.  Sensory exam normal.  ASSESSMENT: 1. 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 2. 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 3. Frontal lobe syndrome.  PLAN:  I agree with Dr.  Maxwell Marion assessment wholeheartedly.  Martin Fitzgerald will need to be careful in not taking on too much too quick.  He still has some problems with working memory, and executive function/decision which will affect him in  stressful and time-limited situations.  He should do well with protocols and procedures he is quite familiar with.  He still has some issues with attention and memory, but he is aware that these are problems.  He also needs to be careful and self-monitoring his language and conversation with others as he can be a bit verbose at times, but for the most part, Martin Fitzgerald seems to understand his limitations and is working on ways to improve them and he is seeing improvement really on a daily basis.  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 I did recommend an initial return to work on a part-time basis, perhaps 2 days a week to start, then increase by a day every week or two until he is at a full 5-day work week of half days. Once he is to that point, he can increase to full-time as tolerated.  He and his company work can work out the specific details of his work titration.  Again, I want Martin Fitzgerald to take it easy at first working on limited hours and with limited clients until he is sure that he can handle the situation.  I  think it would be good idea also to shadow a coworker for a day or two just to get his feet wet. 1. I will see Martin Fitzgerald back in about 3 months just to follow up his     progress.  I think he has made remarkable gains as     noted above. 2. I wrote Voltaren gel prescription for right knee until he sees     Orthopedic Surgery.     Ranelle Oyster, M.D. Electronically Signed    ZTS/MedQ D:  08/15/2010 13:25:21  T:  08/16/2010 01:04:39  Job #:  [id number]  cc:   Martin Fitzgerald, M.D. Fax: [phone]  Martin Fitzgerald, M.D. Fax: [phone]

## 2010-08-22 ENCOUNTER — Ambulatory Visit: Payer: PRIVATE HEALTH INSURANCE | Admitting: Physical Therapy

## 2010-08-24 ENCOUNTER — Ambulatory Visit: Payer: PRIVATE HEALTH INSURANCE | Attending: Orthopedic Surgery | Admitting: Physical Therapy

## 2010-08-31 ENCOUNTER — Ambulatory Visit: Payer: PRIVATE HEALTH INSURANCE | Admitting: Physical Therapy

## 2010-09-01 ENCOUNTER — Encounter: Payer: PRIVATE HEALTH INSURANCE | Admitting: Physical Therapy

## 2010-09-05 ENCOUNTER — Encounter: Payer: PRIVATE HEALTH INSURANCE | Admitting: Physical Therapy

## 2010-09-06 ENCOUNTER — Ambulatory Visit: Payer: PRIVATE HEALTH INSURANCE | Attending: Orthopedic Surgery | Admitting: Physical Therapy

## 2010-09-06 DIAGNOSIS — M2560 Stiffness of unspecified joint, not elsewhere classified: Secondary | ICD-10-CM | POA: Insufficient documentation

## 2010-09-06 DIAGNOSIS — R5381 Other malaise: Secondary | ICD-10-CM | POA: Insufficient documentation

## 2010-09-06 DIAGNOSIS — M2550 Pain in unspecified joint: Secondary | ICD-10-CM | POA: Insufficient documentation

## 2010-09-06 DIAGNOSIS — M6281 Muscle weakness (generalized): Secondary | ICD-10-CM | POA: Insufficient documentation

## 2010-09-06 DIAGNOSIS — IMO0001 Reserved for inherently not codable concepts without codable children: Secondary | ICD-10-CM | POA: Insufficient documentation

## 2010-09-07 ENCOUNTER — Encounter: Payer: PRIVATE HEALTH INSURANCE | Admitting: Physical Therapy

## 2010-09-08 ENCOUNTER — Ambulatory Visit: Payer: PRIVATE HEALTH INSURANCE | Admitting: Physical Therapy

## 2010-09-12 ENCOUNTER — Encounter: Payer: PRIVATE HEALTH INSURANCE | Admitting: Physical Therapy

## 2010-09-13 ENCOUNTER — Ambulatory Visit: Payer: PRIVATE HEALTH INSURANCE | Admitting: Physical Therapy

## 2010-09-14 ENCOUNTER — Ambulatory Visit: Payer: PRIVATE HEALTH INSURANCE | Admitting: Physical Therapy

## 2010-09-14 ENCOUNTER — Encounter: Payer: PRIVATE HEALTH INSURANCE | Admitting: Physical Therapy

## 2010-09-19 ENCOUNTER — Ambulatory Visit: Payer: PRIVATE HEALTH INSURANCE | Admitting: Physical Therapy

## 2010-09-19 ENCOUNTER — Encounter: Payer: PRIVATE HEALTH INSURANCE | Admitting: Physical Therapy

## 2010-09-21 ENCOUNTER — Ambulatory Visit: Payer: PRIVATE HEALTH INSURANCE | Admitting: Physical Therapy

## 2010-09-21 ENCOUNTER — Encounter: Payer: PRIVATE HEALTH INSURANCE | Admitting: Physical Therapy

## 2010-09-26 ENCOUNTER — Encounter: Payer: PRIVATE HEALTH INSURANCE | Admitting: Physical Therapy

## 2010-09-26 ENCOUNTER — Ambulatory Visit: Payer: PRIVATE HEALTH INSURANCE | Admitting: Physical Therapy

## 2010-09-28 ENCOUNTER — Ambulatory Visit: Payer: PRIVATE HEALTH INSURANCE | Admitting: Physical Therapy

## 2010-09-28 ENCOUNTER — Encounter: Payer: PRIVATE HEALTH INSURANCE | Admitting: Physical Therapy

## 2010-10-03 ENCOUNTER — Ambulatory Visit: Payer: PRIVATE HEALTH INSURANCE | Attending: Orthopedic Surgery | Admitting: Physical Therapy

## 2010-10-03 DIAGNOSIS — M2550 Pain in unspecified joint: Secondary | ICD-10-CM | POA: Insufficient documentation

## 2010-10-03 DIAGNOSIS — M6281 Muscle weakness (generalized): Secondary | ICD-10-CM | POA: Insufficient documentation

## 2010-10-03 DIAGNOSIS — M2560 Stiffness of unspecified joint, not elsewhere classified: Secondary | ICD-10-CM | POA: Insufficient documentation

## 2010-10-03 DIAGNOSIS — IMO0001 Reserved for inherently not codable concepts without codable children: Secondary | ICD-10-CM | POA: Insufficient documentation

## 2010-10-03 DIAGNOSIS — R5381 Other malaise: Secondary | ICD-10-CM | POA: Insufficient documentation

## 2010-10-06 ENCOUNTER — Encounter: Payer: PRIVATE HEALTH INSURANCE | Admitting: Physical Therapy

## 2010-11-15 ENCOUNTER — Encounter: Payer: PRIVATE HEALTH INSURANCE | Admitting: Physical Medicine & Rehabilitation

## 2010-11-22 ENCOUNTER — Ambulatory Visit: Payer: PRIVATE HEALTH INSURANCE | Attending: Orthopedic Surgery | Admitting: Physical Therapy

## 2010-11-22 DIAGNOSIS — R5381 Other malaise: Secondary | ICD-10-CM | POA: Insufficient documentation

## 2010-11-22 DIAGNOSIS — M2550 Pain in unspecified joint: Secondary | ICD-10-CM | POA: Insufficient documentation

## 2010-11-22 DIAGNOSIS — M2560 Stiffness of unspecified joint, not elsewhere classified: Secondary | ICD-10-CM | POA: Insufficient documentation

## 2010-11-22 DIAGNOSIS — M6281 Muscle weakness (generalized): Secondary | ICD-10-CM | POA: Insufficient documentation

## 2010-11-22 DIAGNOSIS — IMO0001 Reserved for inherently not codable concepts without codable children: Secondary | ICD-10-CM | POA: Insufficient documentation

## 2010-11-23 ENCOUNTER — Ambulatory Visit: Payer: PRIVATE HEALTH INSURANCE

## 2010-11-28 ENCOUNTER — Ambulatory Visit: Payer: PRIVATE HEALTH INSURANCE

## 2010-11-30 ENCOUNTER — Ambulatory Visit: Payer: PRIVATE HEALTH INSURANCE | Admitting: Physical Therapy

## 2010-12-06 ENCOUNTER — Ambulatory Visit: Payer: PRIVATE HEALTH INSURANCE | Attending: Orthopedic Surgery | Admitting: Physical Therapy

## 2010-12-06 DIAGNOSIS — M2550 Pain in unspecified joint: Secondary | ICD-10-CM | POA: Insufficient documentation

## 2010-12-06 DIAGNOSIS — R5381 Other malaise: Secondary | ICD-10-CM | POA: Insufficient documentation

## 2010-12-06 DIAGNOSIS — IMO0001 Reserved for inherently not codable concepts without codable children: Secondary | ICD-10-CM | POA: Insufficient documentation

## 2010-12-06 DIAGNOSIS — M2560 Stiffness of unspecified joint, not elsewhere classified: Secondary | ICD-10-CM | POA: Insufficient documentation

## 2010-12-06 DIAGNOSIS — M6281 Muscle weakness (generalized): Secondary | ICD-10-CM | POA: Insufficient documentation

## 2010-12-08 ENCOUNTER — Ambulatory Visit: Payer: PRIVATE HEALTH INSURANCE | Admitting: Physical Therapy

## 2010-12-12 ENCOUNTER — Ambulatory Visit: Payer: PRIVATE HEALTH INSURANCE

## 2010-12-14 ENCOUNTER — Ambulatory Visit: Payer: PRIVATE HEALTH INSURANCE

## 2010-12-21 ENCOUNTER — Ambulatory Visit: Payer: PRIVATE HEALTH INSURANCE

## 2010-12-26 ENCOUNTER — Ambulatory Visit: Payer: PRIVATE HEALTH INSURANCE

## 2010-12-28 ENCOUNTER — Ambulatory Visit: Payer: PRIVATE HEALTH INSURANCE

## 2011-01-02 ENCOUNTER — Ambulatory Visit: Payer: PRIVATE HEALTH INSURANCE | Attending: Orthopedic Surgery | Admitting: Physical Therapy

## 2011-01-02 DIAGNOSIS — R5381 Other malaise: Secondary | ICD-10-CM | POA: Insufficient documentation

## 2011-01-02 DIAGNOSIS — M2550 Pain in unspecified joint: Secondary | ICD-10-CM | POA: Insufficient documentation

## 2011-01-02 DIAGNOSIS — M6281 Muscle weakness (generalized): Secondary | ICD-10-CM | POA: Insufficient documentation

## 2011-01-02 DIAGNOSIS — M2560 Stiffness of unspecified joint, not elsewhere classified: Secondary | ICD-10-CM | POA: Insufficient documentation

## 2011-01-02 DIAGNOSIS — IMO0001 Reserved for inherently not codable concepts without codable children: Secondary | ICD-10-CM | POA: Insufficient documentation

## 2011-01-04 ENCOUNTER — Ambulatory Visit: Payer: PRIVATE HEALTH INSURANCE | Admitting: Physical Therapy

## 2011-01-09 ENCOUNTER — Ambulatory Visit: Payer: PRIVATE HEALTH INSURANCE | Admitting: Physical Therapy

## 2011-01-11 ENCOUNTER — Ambulatory Visit: Payer: PRIVATE HEALTH INSURANCE | Admitting: Physical Therapy

## 2011-01-16 ENCOUNTER — Ambulatory Visit: Payer: PRIVATE HEALTH INSURANCE | Admitting: Physical Therapy

## 2011-01-18 ENCOUNTER — Ambulatory Visit: Payer: PRIVATE HEALTH INSURANCE | Admitting: Physical Therapy
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
## 2012-03-13 IMAGING — CT CT EXTREM LOW W/O CM*R*
2 of 4 series · 4 of 14 positions shown, 5 images · non-contrast
Comparison: None.

CLINICAL DATA: Talus fracture.  Ankle fracture.

CT RIGHT ANKLE WITHOUT CONTRAST
TECHNIQUE: Multidetector CT imaging of the right ankle was
performed according to the standard protocol without intravenous
contrast. Multiplanar CT image reconstructions were also generated.

[Series 301: cor1 · sagittal · 0.40mm/px · 2 of 62 slices shown, 3 images]
[im 21/62  soft-tissue]
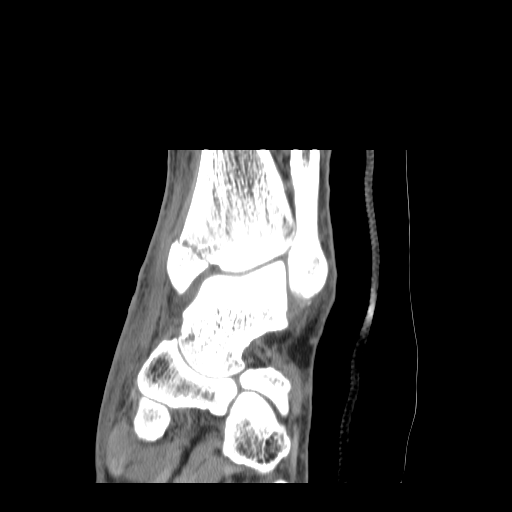
[im 21/62  bone]
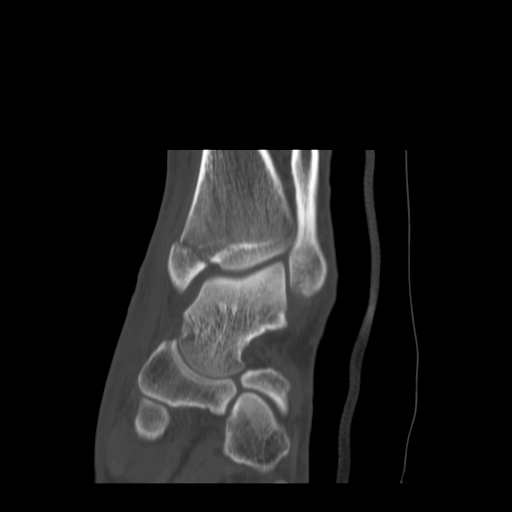
[im 41/62  bone]
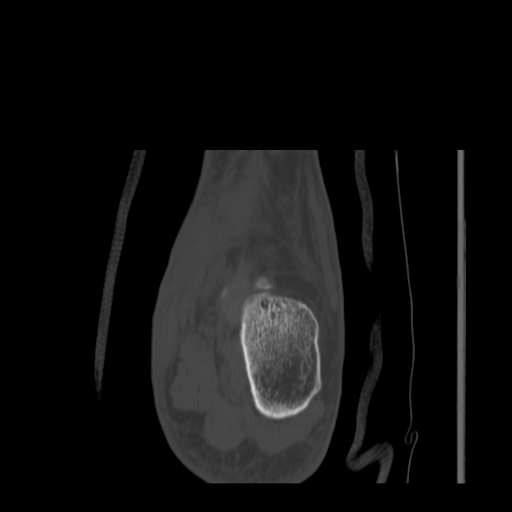

[Series 302: sag · coronal · 0.40mm/px · 2 of 62 slices shown]
[im 21/62  bone]
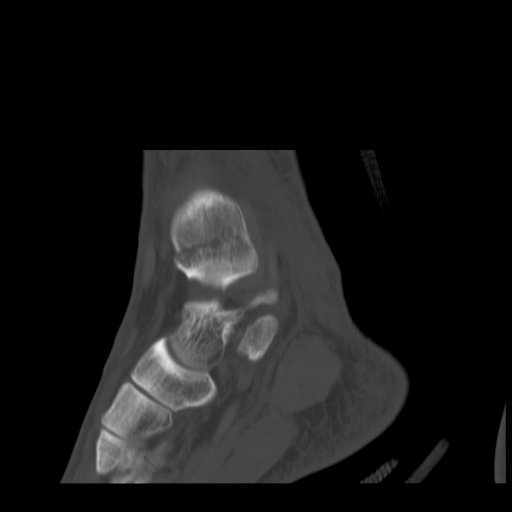
[im 41/62  bone]
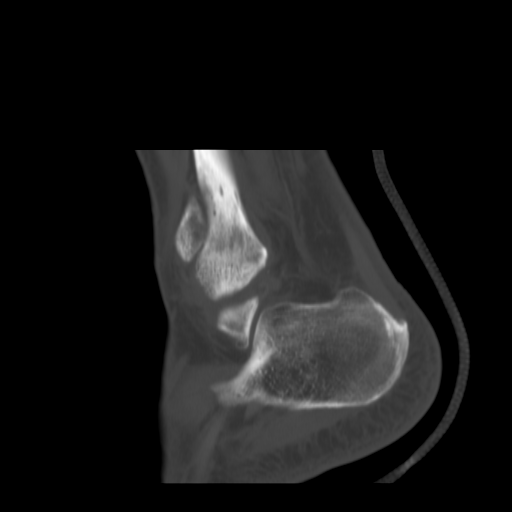

[4 of 14 positions shown; findings below may reference images not displayed]

FINDINGS: There is an oblique mildly displaced fracture through
the medial malleolus that extends into the medial aspect of the
tibial plafond.  The lateral malleolus is intact.  There is minimal
comminution of fracture anteriorly.  The ankle mortise remains
congruent.

The talus is intact.  Subtalar joints appear normal.  Midfoot also
appears normal.  Achilles tendon within normal limits.
Subcutaneous hematoma is present in medial ankle.  Mild
irregularity of the skin suggests fracture blister.  Small fracture
fragments present in the posterior recess of the ankle joint.
IMPRESSION: 1.  Base of medial malleolus fracture extending into the medial
tibial plafond.
2.  Intact talus.

## 2012-11-11 ENCOUNTER — Ambulatory Visit (INDEPENDENT_AMBULATORY_CARE_PROVIDER_SITE_OTHER): Payer: BC Managed Care – PPO | Admitting: Psychology

## 2012-11-11 DIAGNOSIS — Z87820 Personal history of traumatic brain injury: Secondary | ICD-10-CM

## 2012-11-11 DIAGNOSIS — IMO0002 Reserved for concepts with insufficient information to code with codable children: Secondary | ICD-10-CM

## 2012-11-13 ENCOUNTER — Encounter (HOSPITAL_COMMUNITY): Payer: Self-pay | Admitting: Psychology
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
## 2012-11-15 ENCOUNTER — Ambulatory Visit (HOSPITAL_COMMUNITY): Payer: Self-pay | Admitting: Psychology

## 2012-11-27 ENCOUNTER — Ambulatory Visit (INDEPENDENT_AMBULATORY_CARE_PROVIDER_SITE_OTHER): Payer: BC Managed Care – PPO | Admitting: Psychology

## 2012-11-27 DIAGNOSIS — Z87820 Personal history of traumatic brain injury: Secondary | ICD-10-CM

## 2012-11-27 DIAGNOSIS — IMO0002 Reserved for concepts with insufficient information to code with codable children: Secondary | ICD-10-CM

## 2012-12-17 ENCOUNTER — Ambulatory Visit (INDEPENDENT_AMBULATORY_CARE_PROVIDER_SITE_OTHER): Payer: BC Managed Care – PPO | Admitting: Psychology

## 2012-12-17 DIAGNOSIS — Z87820 Personal history of traumatic brain injury: Secondary | ICD-10-CM

## 2012-12-17 DIAGNOSIS — IMO0002 Reserved for concepts with insufficient information to code with codable children: Secondary | ICD-10-CM

## 2012-12-19 ENCOUNTER — Encounter (HOSPITAL_COMMUNITY): Payer: Self-pay | Admitting: Psychology
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
## 2013-01-09 ENCOUNTER — Ambulatory Visit (HOSPITAL_COMMUNITY): Payer: Self-pay | Admitting: Psychology

## 2013-05-13 ENCOUNTER — Other Ambulatory Visit: Payer: Self-pay | Admitting: Dermatology

## 2019-08-27 ENCOUNTER — Telehealth: Payer: Self-pay | Admitting: *Deleted

## 2019-08-27 NOTE — Telephone Encounter (Signed)
Previous pt of Dr Kieth Brightly in [place], and Mr Wallen needs a letter from him for his employer stating that his TBI will cause a little bit slower intake or retention of information, not that he can't do it, but that there are legitimate reasons for his retention of information

## 2020-04-13 ENCOUNTER — Other Ambulatory Visit: Payer: Self-pay | Admitting: Occupational Medicine

## 2020-04-13 ENCOUNTER — Ambulatory Visit: Payer: Self-pay

## 2020-04-13 ENCOUNTER — Other Ambulatory Visit: Payer: Self-pay

## 2020-04-13 DIAGNOSIS — Z0000 Encounter for general adult medical examination without abnormal findings: Secondary | ICD-10-CM

## 2021-07-12 ENCOUNTER — Other Ambulatory Visit (HOSPITAL_BASED_OUTPATIENT_CLINIC_OR_DEPARTMENT_OTHER): Payer: Self-pay

## 2021-09-21 ENCOUNTER — Encounter: Payer: Self-pay | Admitting: Psychology

## 2021-09-21 ENCOUNTER — Telehealth: Payer: Self-pay | Admitting: Registered Nurse

## 2021-09-21 DIAGNOSIS — S069X0D Unspecified intracranial injury without loss of consciousness, subsequent encounter: Secondary | ICD-10-CM

## 2021-09-21 NOTE — Telephone Encounter (Signed)
Referral placed for Psychology: er Dr Riley Kill request to April.

## 2021-09-23 ENCOUNTER — Other Ambulatory Visit (HOSPITAL_BASED_OUTPATIENT_CLINIC_OR_DEPARTMENT_OTHER): Payer: Self-pay

## 2021-09-23 MED ORDER — AMPHETAMINE-DEXTROAMPHETAMINE 10 MG PO TABS
ORAL_TABLET | ORAL | 0 refills | Status: DC
  Filled 2021-09-23: qty 30, 30d supply, fill #0

## 2021-10-27 ENCOUNTER — Other Ambulatory Visit (HOSPITAL_BASED_OUTPATIENT_CLINIC_OR_DEPARTMENT_OTHER): Payer: Self-pay

## 2021-10-31 ENCOUNTER — Other Ambulatory Visit (HOSPITAL_BASED_OUTPATIENT_CLINIC_OR_DEPARTMENT_OTHER): Payer: Self-pay

## 2021-10-31 MED ORDER — AMPHETAMINE-DEXTROAMPHETAMINE 10 MG PO TABS
ORAL_TABLET | ORAL | 0 refills | Status: DC
  Filled 2021-10-31: qty 30, 30d supply, fill #0

## 2021-11-02 ENCOUNTER — Ambulatory Visit (INDEPENDENT_AMBULATORY_CARE_PROVIDER_SITE_OTHER): Payer: 59 | Admitting: Pulmonary Disease

## 2021-11-02 ENCOUNTER — Encounter: Payer: Self-pay | Admitting: Pulmonary Disease

## 2021-11-02 DIAGNOSIS — G4710 Hypersomnia, unspecified: Secondary | ICD-10-CM | POA: Insufficient documentation

## 2021-11-02 NOTE — Patient Instructions (Signed)
X Home sleep test 

## 2021-11-02 NOTE — Assessment & Plan Note (Signed)
Given excessive daytime somnolence, narrow pharyngeal exam, witnessed apneas & loud snoring, obstructive sleep apnea is very likely & an overnight polysomnogram will be scheduled as a home study. The pathophysiology of obstructive sleep apnea , it's cardiovascular consequences & modes of treatment including CPAP were discused with the patient in detail & they evidenced understanding.  He does not have the body habitus for OSA but pretest probability is still high due to his other symptoms.  Hypersomnolence dates back more than 10 years prior to TBI.  If home sleep test does not show significant sleep disordered breathing, we will evaluate for somnolence with NPSG and MSLT
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
## 2021-11-03 ENCOUNTER — Other Ambulatory Visit (HOSPITAL_BASED_OUTPATIENT_CLINIC_OR_DEPARTMENT_OTHER): Payer: Self-pay

## 2021-11-03 MED ORDER — VALACYCLOVIR HCL 500 MG PO TABS
ORAL_TABLET | ORAL | 0 refills | Status: DC
  Filled 2021-11-03: qty 14, 7d supply, fill #0

## 2021-11-09 ENCOUNTER — Encounter: Payer: 59 | Attending: Psychology | Admitting: Psychology

## 2021-11-09 ENCOUNTER — Other Ambulatory Visit (HOSPITAL_BASED_OUTPATIENT_CLINIC_OR_DEPARTMENT_OTHER): Payer: Self-pay

## 2021-11-09 DIAGNOSIS — R41840 Attention and concentration deficit: Secondary | ICD-10-CM | POA: Insufficient documentation

## 2021-11-09 DIAGNOSIS — Z87820 Personal history of traumatic brain injury: Secondary | ICD-10-CM | POA: Diagnosis not present

## 2021-11-09 MED ORDER — AMPHETAMINE-DEXTROAMPHET ER 10 MG PO CP24
ORAL_CAPSULE | ORAL | 0 refills | Status: DC
  Filled 2021-11-09: qty 30, 30d supply, fill #0

## 2021-11-25 ENCOUNTER — Other Ambulatory Visit (HOSPITAL_BASED_OUTPATIENT_CLINIC_OR_DEPARTMENT_OTHER): Payer: Self-pay

## 2021-11-28 ENCOUNTER — Other Ambulatory Visit (HOSPITAL_BASED_OUTPATIENT_CLINIC_OR_DEPARTMENT_OTHER): Payer: Self-pay

## 2021-11-28 ENCOUNTER — Encounter (HOSPITAL_BASED_OUTPATIENT_CLINIC_OR_DEPARTMENT_OTHER): Payer: Self-pay | Admitting: Pharmacist

## 2021-11-28 MED ORDER — VALACYCLOVIR HCL 500 MG PO TABS
ORAL_TABLET | ORAL | 0 refills | Status: DC
  Filled 2021-11-28: qty 14, 7d supply, fill #0

## 2021-11-29 ENCOUNTER — Other Ambulatory Visit (HOSPITAL_BASED_OUTPATIENT_CLINIC_OR_DEPARTMENT_OTHER): Payer: Self-pay

## 2021-12-08 ENCOUNTER — Other Ambulatory Visit (HOSPITAL_BASED_OUTPATIENT_CLINIC_OR_DEPARTMENT_OTHER): Payer: Self-pay

## 2021-12-09 ENCOUNTER — Other Ambulatory Visit (HOSPITAL_BASED_OUTPATIENT_CLINIC_OR_DEPARTMENT_OTHER): Payer: Self-pay

## 2021-12-09 MED ORDER — VALACYCLOVIR HCL 500 MG PO TABS
500.0000 mg | ORAL_TABLET | Freq: Two times a day (BID) | ORAL | 0 refills | Status: DC
Start: 2021-12-09 — End: 2021-12-27
  Filled 2021-12-09: qty 14, 7d supply, fill #0

## 2021-12-11 ENCOUNTER — Other Ambulatory Visit (HOSPITAL_BASED_OUTPATIENT_CLINIC_OR_DEPARTMENT_OTHER): Payer: Self-pay

## 2021-12-12 ENCOUNTER — Other Ambulatory Visit (HOSPITAL_BASED_OUTPATIENT_CLINIC_OR_DEPARTMENT_OTHER): Payer: Self-pay

## 2021-12-12 MED ORDER — AMPHETAMINE-DEXTROAMPHET ER 10 MG PO CP24
ORAL_CAPSULE | ORAL | 0 refills | Status: DC
  Filled 2021-12-12: qty 30, 30d supply, fill #0
  Filled 2021-12-27: qty 30, fill #0
  Filled 2022-01-09: qty 30, 30d supply, fill #0

## 2021-12-27 ENCOUNTER — Other Ambulatory Visit (HOSPITAL_BASED_OUTPATIENT_CLINIC_OR_DEPARTMENT_OTHER): Payer: Self-pay

## 2021-12-27 ENCOUNTER — Encounter: Payer: Self-pay | Admitting: Pulmonary Disease

## 2021-12-27 MED ORDER — VALACYCLOVIR HCL 500 MG PO TABS
500.0000 mg | ORAL_TABLET | Freq: Two times a day (BID) | ORAL | 0 refills | Status: DC
  Filled 2021-12-27: qty 14, 7d supply, fill #0

## 2021-12-28 ENCOUNTER — Encounter: Payer: Self-pay | Admitting: Pulmonary Disease

## 2021-12-29 ENCOUNTER — Encounter: Payer: Self-pay | Admitting: Psychology
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
## 2021-12-30 NOTE — Telephone Encounter (Signed)
Mychart message sent by pt stating if you are not able to reach him on mobile number to get HST scheduled, you can call him on his work number at [phone].  Routing to PCCs.

## 2022-01-02 NOTE — Telephone Encounter (Signed)
Spoke to patient and schedule his hst

## 2022-01-05 ENCOUNTER — Ambulatory Visit: Payer: 59

## 2022-01-05 DIAGNOSIS — G4733 Obstructive sleep apnea (adult) (pediatric): Secondary | ICD-10-CM | POA: Diagnosis not present

## 2022-01-05 DIAGNOSIS — G4710 Hypersomnia, unspecified: Secondary | ICD-10-CM

## 2022-01-09 ENCOUNTER — Other Ambulatory Visit (HOSPITAL_BASED_OUTPATIENT_CLINIC_OR_DEPARTMENT_OTHER): Payer: Self-pay

## 2022-01-10 ENCOUNTER — Other Ambulatory Visit (HOSPITAL_BASED_OUTPATIENT_CLINIC_OR_DEPARTMENT_OTHER): Payer: Self-pay

## 2022-01-10 ENCOUNTER — Encounter (HOSPITAL_BASED_OUTPATIENT_CLINIC_OR_DEPARTMENT_OTHER): Payer: Self-pay | Admitting: Pharmacist

## 2022-01-13 ENCOUNTER — Other Ambulatory Visit (HOSPITAL_BASED_OUTPATIENT_CLINIC_OR_DEPARTMENT_OTHER): Payer: Self-pay

## 2022-01-13 MED ORDER — AMPHETAMINE-DEXTROAMPHET ER 10 MG PO CP24
10.0000 mg | ORAL_CAPSULE | Freq: Every morning | ORAL | 0 refills | Status: DC
  Filled 2022-02-17: qty 30, 30d supply, fill #0

## 2022-01-18 ENCOUNTER — Other Ambulatory Visit (HOSPITAL_BASED_OUTPATIENT_CLINIC_OR_DEPARTMENT_OTHER): Payer: Self-pay

## 2022-01-18 MED ORDER — VALACYCLOVIR HCL 500 MG PO TABS
500.0000 mg | ORAL_TABLET | Freq: Every day | ORAL | 4 refills | Status: DC
  Filled 2022-01-18: qty 90, 90d supply, fill #0
  Filled 2022-04-13: qty 90, 90d supply, fill #1
  Filled 2022-10-13: qty 90, 90d supply, fill #2
  Filled 2023-01-10: qty 90, 90d supply, fill #3

## 2022-01-19 ENCOUNTER — Telehealth: Payer: Self-pay | Admitting: Pulmonary Disease

## 2022-01-19 DIAGNOSIS — G4733 Obstructive sleep apnea (adult) (pediatric): Secondary | ICD-10-CM | POA: Diagnosis not present

## 2022-01-19 NOTE — Telephone Encounter (Signed)
ATC patient.  LMTCB. 

## 2022-01-19 NOTE — Telephone Encounter (Signed)
HST showed mild  OSA with AHI 12 / hr Events were mostly noted on his back  Options include - trial of CPAP (Suggest autoCPAP  5-15 cm, mask of choice ) - avoid sleeping on his back  If does not improve, we may have to test for other conditions with slsep study in our lab (+MSLT)   OV with me/APP in 6 wks

## 2022-01-25 NOTE — Telephone Encounter (Signed)
ATC patient. Vm was full. Will mail patient a letter asking him to contact office  for results.

## 2022-02-17 ENCOUNTER — Other Ambulatory Visit (HOSPITAL_BASED_OUTPATIENT_CLINIC_OR_DEPARTMENT_OTHER): Payer: Self-pay

## 2022-03-20 ENCOUNTER — Other Ambulatory Visit (HOSPITAL_BASED_OUTPATIENT_CLINIC_OR_DEPARTMENT_OTHER): Payer: Self-pay

## 2022-03-20 MED ORDER — AMPHETAMINE-DEXTROAMPHET ER 10 MG PO CP24
10.0000 mg | ORAL_CAPSULE | Freq: Every morning | ORAL | 0 refills | Status: DC
  Filled 2022-03-20: qty 30, 30d supply, fill #0

## 2022-03-29 ENCOUNTER — Encounter: Payer: 59 | Admitting: Psychology

## 2022-04-04 ENCOUNTER — Encounter: Payer: Commercial Managed Care - PPO | Attending: Psychology | Admitting: Psychology

## 2022-04-04 DIAGNOSIS — R41840 Attention and concentration deficit: Secondary | ICD-10-CM | POA: Diagnosis not present

## 2022-04-04 DIAGNOSIS — Z87820 Personal history of traumatic brain injury: Secondary | ICD-10-CM

## 2022-04-09 ENCOUNTER — Encounter: Payer: Self-pay | Admitting: Psychology
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
## 2022-04-11 ENCOUNTER — Encounter: Payer: Commercial Managed Care - PPO | Admitting: Psychology

## 2022-04-11 DIAGNOSIS — S069X0D Unspecified intracranial injury without loss of consciousness, subsequent encounter: Secondary | ICD-10-CM | POA: Diagnosis not present

## 2022-04-11 DIAGNOSIS — R41840 Attention and concentration deficit: Secondary | ICD-10-CM

## 2022-04-11 DIAGNOSIS — Z87820 Personal history of traumatic brain injury: Secondary | ICD-10-CM | POA: Diagnosis not present

## 2022-04-13 ENCOUNTER — Ambulatory Visit: Payer: 59 | Admitting: Psychology

## 2022-04-19 ENCOUNTER — Ambulatory Visit: Payer: 59 | Admitting: Psychology

## 2022-04-25 ENCOUNTER — Encounter: Payer: Commercial Managed Care - PPO | Admitting: Psychology

## 2022-04-25 DIAGNOSIS — R41840 Attention and concentration deficit: Secondary | ICD-10-CM | POA: Diagnosis not present

## 2022-04-25 DIAGNOSIS — Z87820 Personal history of traumatic brain injury: Secondary | ICD-10-CM | POA: Diagnosis not present

## 2022-04-25 DIAGNOSIS — S069X0D Unspecified intracranial injury without loss of consciousness, subsequent encounter: Secondary | ICD-10-CM

## 2022-05-07 ENCOUNTER — Other Ambulatory Visit (HOSPITAL_BASED_OUTPATIENT_CLINIC_OR_DEPARTMENT_OTHER): Payer: Self-pay

## 2022-05-07 MED ORDER — AMPHETAMINE-DEXTROAMPHET ER 10 MG PO CP24
10.0000 mg | ORAL_CAPSULE | Freq: Every morning | ORAL | 0 refills | Status: DC
  Filled 2022-05-07: qty 30, 30d supply, fill #0

## 2022-05-08 ENCOUNTER — Other Ambulatory Visit (HOSPITAL_BASED_OUTPATIENT_CLINIC_OR_DEPARTMENT_OTHER): Payer: Self-pay

## 2022-05-21 ENCOUNTER — Encounter: Payer: Self-pay | Admitting: Psychology
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
## 2022-05-24 ENCOUNTER — Other Ambulatory Visit (HOSPITAL_BASED_OUTPATIENT_CLINIC_OR_DEPARTMENT_OTHER): Payer: Self-pay

## 2022-05-24 MED ORDER — AMPHETAMINE-DEXTROAMPHETAMINE 20 MG PO TABS
20.0000 mg | ORAL_TABLET | Freq: Every day | ORAL | 0 refills | Status: DC
  Filled 2022-05-24: qty 30, 30d supply, fill #0

## 2022-05-24 MED ORDER — AMPHETAMINE-DEXTROAMPHET ER 25 MG PO CP24
25.0000 mg | ORAL_CAPSULE | Freq: Every morning | ORAL | 0 refills | Status: DC
  Filled 2022-05-24: qty 30, 30d supply, fill #0

## 2022-06-07 DIAGNOSIS — F902 Attention-deficit hyperactivity disorder, combined type: Secondary | ICD-10-CM | POA: Diagnosis not present

## 2022-06-08 ENCOUNTER — Other Ambulatory Visit (HOSPITAL_BASED_OUTPATIENT_CLINIC_OR_DEPARTMENT_OTHER): Payer: Self-pay

## 2022-06-16 DIAGNOSIS — F902 Attention-deficit hyperactivity disorder, combined type: Secondary | ICD-10-CM | POA: Diagnosis not present

## 2022-06-17 DIAGNOSIS — F902 Attention-deficit hyperactivity disorder, combined type: Secondary | ICD-10-CM | POA: Diagnosis not present

## 2022-06-21 ENCOUNTER — Other Ambulatory Visit (HOSPITAL_BASED_OUTPATIENT_CLINIC_OR_DEPARTMENT_OTHER): Payer: Self-pay

## 2022-06-21 ENCOUNTER — Encounter (HOSPITAL_BASED_OUTPATIENT_CLINIC_OR_DEPARTMENT_OTHER): Payer: Self-pay | Admitting: Pharmacist

## 2022-06-21 ENCOUNTER — Other Ambulatory Visit: Payer: Self-pay

## 2022-06-21 DIAGNOSIS — F902 Attention-deficit hyperactivity disorder, combined type: Secondary | ICD-10-CM | POA: Diagnosis not present

## 2022-06-21 MED ORDER — FLUVOXAMINE MALEATE 50 MG PO TABS
50.0000 mg | ORAL_TABLET | Freq: Every day | ORAL | 0 refills | Status: DC
  Filled 2022-06-21: qty 90, 90d supply, fill #0

## 2022-06-21 MED ORDER — LISDEXAMFETAMINE DIMESYLATE 30 MG PO CAPS
30.0000 mg | ORAL_CAPSULE | Freq: Every day | ORAL | 0 refills | Status: DC
  Filled 2022-06-21: qty 30, 30d supply, fill #0

## 2022-06-26 ENCOUNTER — Other Ambulatory Visit: Payer: Self-pay

## 2022-06-27 ENCOUNTER — Encounter: Payer: Self-pay | Admitting: Psychology
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
## 2022-07-05 DIAGNOSIS — F429 Obsessive-compulsive disorder, unspecified: Secondary | ICD-10-CM | POA: Diagnosis not present

## 2022-07-05 DIAGNOSIS — F902 Attention-deficit hyperactivity disorder, combined type: Secondary | ICD-10-CM | POA: Diagnosis not present

## 2022-08-02 ENCOUNTER — Other Ambulatory Visit (HOSPITAL_BASED_OUTPATIENT_CLINIC_OR_DEPARTMENT_OTHER): Payer: Self-pay

## 2022-08-02 DIAGNOSIS — F902 Attention-deficit hyperactivity disorder, combined type: Secondary | ICD-10-CM | POA: Diagnosis not present

## 2022-08-02 DIAGNOSIS — F429 Obsessive-compulsive disorder, unspecified: Secondary | ICD-10-CM | POA: Diagnosis not present

## 2022-08-02 MED ORDER — LISDEXAMFETAMINE DIMESYLATE 30 MG PO CAPS
30.0000 mg | ORAL_CAPSULE | Freq: Every day | ORAL | 0 refills | Status: DC
  Filled 2022-08-02: qty 30, 30d supply, fill #0

## 2022-08-03 ENCOUNTER — Other Ambulatory Visit (HOSPITAL_BASED_OUTPATIENT_CLINIC_OR_DEPARTMENT_OTHER): Payer: Self-pay

## 2022-08-18 ENCOUNTER — Other Ambulatory Visit (HOSPITAL_BASED_OUTPATIENT_CLINIC_OR_DEPARTMENT_OTHER): Payer: Self-pay

## 2022-08-18 DIAGNOSIS — D23121 Other benign neoplasm of skin of left upper eyelid, including canthus: Secondary | ICD-10-CM | POA: Diagnosis not present

## 2022-08-18 MED ORDER — ERYTHROMYCIN 5 MG/GM OP OINT
TOPICAL_OINTMENT | OPHTHALMIC | 0 refills | Status: DC
  Filled 2022-08-18: qty 3.5, 5d supply, fill #0

## 2022-08-30 ENCOUNTER — Other Ambulatory Visit (HOSPITAL_BASED_OUTPATIENT_CLINIC_OR_DEPARTMENT_OTHER): Payer: Self-pay

## 2022-08-30 DIAGNOSIS — F429 Obsessive-compulsive disorder, unspecified: Secondary | ICD-10-CM | POA: Diagnosis not present

## 2022-08-30 DIAGNOSIS — F902 Attention-deficit hyperactivity disorder, combined type: Secondary | ICD-10-CM | POA: Diagnosis not present

## 2022-08-30 MED ORDER — LISDEXAMFETAMINE DIMESYLATE 30 MG PO CAPS
30.0000 mg | ORAL_CAPSULE | Freq: Every day | ORAL | 0 refills | Status: DC
  Filled 2022-09-11: qty 30, 30d supply, fill #0

## 2022-08-30 MED ORDER — FLUVOXAMINE MALEATE ER 100 MG PO CP24
ORAL_CAPSULE | ORAL | 0 refills | Status: DC
  Filled 2022-08-30: qty 90, 90d supply, fill #0

## 2022-09-01 ENCOUNTER — Other Ambulatory Visit (HOSPITAL_BASED_OUTPATIENT_CLINIC_OR_DEPARTMENT_OTHER): Payer: Self-pay

## 2022-09-02 ENCOUNTER — Other Ambulatory Visit (HOSPITAL_BASED_OUTPATIENT_CLINIC_OR_DEPARTMENT_OTHER): Payer: Self-pay

## 2022-09-05 ENCOUNTER — Other Ambulatory Visit (HOSPITAL_BASED_OUTPATIENT_CLINIC_OR_DEPARTMENT_OTHER): Payer: Self-pay

## 2022-09-05 MED ORDER — MUPIROCIN 2 % EX OINT
TOPICAL_OINTMENT | CUTANEOUS | 0 refills | Status: DC
  Filled 2022-09-05: qty 22, 30d supply, fill #0

## 2022-09-06 ENCOUNTER — Other Ambulatory Visit (HOSPITAL_BASED_OUTPATIENT_CLINIC_OR_DEPARTMENT_OTHER): Payer: Self-pay

## 2022-09-06 ENCOUNTER — Other Ambulatory Visit (HOSPITAL_COMMUNITY): Payer: Self-pay

## 2022-09-06 MED ORDER — FLUVOXAMINE MALEATE 100 MG PO TABS
200.0000 mg | ORAL_TABLET | Freq: Every day | ORAL | 0 refills | Status: DC
  Filled 2022-09-06: qty 180, 90d supply, fill #0

## 2022-09-11 ENCOUNTER — Other Ambulatory Visit (HOSPITAL_BASED_OUTPATIENT_CLINIC_OR_DEPARTMENT_OTHER): Payer: Self-pay

## 2022-09-25 ENCOUNTER — Other Ambulatory Visit (HOSPITAL_BASED_OUTPATIENT_CLINIC_OR_DEPARTMENT_OTHER): Payer: Self-pay

## 2022-09-27 ENCOUNTER — Other Ambulatory Visit (HOSPITAL_BASED_OUTPATIENT_CLINIC_OR_DEPARTMENT_OTHER): Payer: Self-pay

## 2022-09-27 DIAGNOSIS — F902 Attention-deficit hyperactivity disorder, combined type: Secondary | ICD-10-CM | POA: Diagnosis not present

## 2022-09-27 DIAGNOSIS — F429 Obsessive-compulsive disorder, unspecified: Secondary | ICD-10-CM | POA: Diagnosis not present

## 2022-09-27 MED ORDER — LISDEXAMFETAMINE DIMESYLATE 30 MG PO CAPS
30.0000 mg | ORAL_CAPSULE | Freq: Every day | ORAL | 0 refills | Status: DC
  Filled 2022-09-27 – 2023-01-10 (×2): qty 30, 30d supply, fill #0

## 2022-10-04 DIAGNOSIS — Z1211 Encounter for screening for malignant neoplasm of colon: Secondary | ICD-10-CM | POA: Diagnosis not present

## 2022-10-04 DIAGNOSIS — Z1212 Encounter for screening for malignant neoplasm of rectum: Secondary | ICD-10-CM | POA: Diagnosis not present

## 2022-10-11 ENCOUNTER — Other Ambulatory Visit (HOSPITAL_BASED_OUTPATIENT_CLINIC_OR_DEPARTMENT_OTHER): Payer: Self-pay

## 2022-10-11 DIAGNOSIS — F429 Obsessive-compulsive disorder, unspecified: Secondary | ICD-10-CM | POA: Diagnosis not present

## 2022-10-11 DIAGNOSIS — D239 Other benign neoplasm of skin, unspecified: Secondary | ICD-10-CM

## 2022-10-11 DIAGNOSIS — F902 Attention-deficit hyperactivity disorder, combined type: Secondary | ICD-10-CM | POA: Diagnosis not present

## 2022-10-11 HISTORY — DX: Other benign neoplasm of skin, unspecified: D23.9

## 2022-10-11 LAB — EXTERNAL GENERIC LAB PROCEDURE: COLOGUARD: NEGATIVE

## 2022-10-11 LAB — COLOGUARD: COLOGUARD: NEGATIVE

## 2022-10-11 MED ORDER — LISDEXAMFETAMINE DIMESYLATE 30 MG PO CAPS
30.0000 mg | ORAL_CAPSULE | Freq: Every day | ORAL | 0 refills | Status: DC
  Filled 2022-10-11: qty 30, 30d supply, fill #0

## 2022-10-11 MED ORDER — AUVELITY 45-105 MG PO TBCR
1.0000 | EXTENDED_RELEASE_TABLET | Freq: Two times a day (BID) | ORAL | 0 refills | Status: DC
  Filled 2022-10-11: qty 180, 90d supply, fill #0

## 2022-10-12 ENCOUNTER — Encounter: Payer: Self-pay | Admitting: Dermatology

## 2022-10-17 ENCOUNTER — Ambulatory Visit (INDEPENDENT_AMBULATORY_CARE_PROVIDER_SITE_OTHER): Payer: Commercial Managed Care - PPO | Admitting: Dermatology

## 2022-10-17 ENCOUNTER — Encounter: Payer: Self-pay | Admitting: Dermatology

## 2022-10-17 VITALS — BP 131/85 | HR 66

## 2022-10-17 DIAGNOSIS — L821 Other seborrheic keratosis: Secondary | ICD-10-CM

## 2022-10-17 DIAGNOSIS — D225 Melanocytic nevi of trunk: Secondary | ICD-10-CM

## 2022-10-17 DIAGNOSIS — L814 Other melanin hyperpigmentation: Secondary | ICD-10-CM

## 2022-10-17 DIAGNOSIS — D229 Melanocytic nevi, unspecified: Secondary | ICD-10-CM

## 2022-10-17 DIAGNOSIS — Z1283 Encounter for screening for malignant neoplasm of skin: Secondary | ICD-10-CM | POA: Diagnosis not present

## 2022-10-17 DIAGNOSIS — L578 Other skin changes due to chronic exposure to nonionizing radiation: Secondary | ICD-10-CM

## 2022-10-17 DIAGNOSIS — D2271 Melanocytic nevi of right lower limb, including hip: Secondary | ICD-10-CM

## 2022-10-17 DIAGNOSIS — W908XXA Exposure to other nonionizing radiation, initial encounter: Secondary | ICD-10-CM

## 2022-10-17 DIAGNOSIS — D1801 Hemangioma of skin and subcutaneous tissue: Secondary | ICD-10-CM

## 2022-10-17 DIAGNOSIS — Z85828 Personal history of other malignant neoplasm of skin: Secondary | ICD-10-CM | POA: Diagnosis not present

## 2022-10-17 DIAGNOSIS — D485 Neoplasm of uncertain behavior of skin: Secondary | ICD-10-CM

## 2022-10-17 NOTE — Patient Instructions (Addendum)
Hello Martin Fitzgerald,  Thank you for visiting my office today. I appreciate your commitment to monitoring your skin health, especially given your history of skin cancer. Here is a summary of the key points from our consultation:  - Skin Examination: We conducted a thorough head-to-toe skin check. Please continue to monitor your skin monthly and compare any changes using photos. - Biopsies: We performed shave biopsies on three suspicious lesions:   - A 1.2 cm by 7 mm irregular dark brown papule.   - A 3 mm irregular brown macule with a black speckle.   - A 4 mm irregular shaped black macule.  - Post-Biopsy Care: Remove the bandages tomorrow morning, wash the areas with soapy water, and apply ointment and new bandages. - Sun Protection: It is crucial to use sunscreen regularly, especially given your history and recent biopsies. Apply it even when it seems inconvenient.  - Follow-Up: Please return for a follow-up in one year, or sooner if you notice any significant changes in your skin. Regular checks are essential due to the variety of moles and lesions you have.  - Skin Tags: Avoid removing skin tags yourself to prevent infection. We can address any that become irritated.  Please feel free to reach out if you have any questions or concerns about your skin or the biopsy results once they are available. Remember, early detection and protection are key to managing skin health.  Wishing you a healthy and safe summer ahead.    Patient Handout: Wound Care for Skin Biopsy Site  Taking Care of Your Skin Biopsy Site  Proper care of the biopsy site is essential for promoting healing and minimizing scarring. This handout provides instructions on how to care for your biopsy site to ensure optimal recovery.  1. Cleaning the Wound:  Clean the biopsy site daily with gentle soap and water. Gently pat the area dry with a clean, soft towel. Avoid harsh scrubbing or rubbing the area, as this can irritate the skin  and delay healing.  2. Applying Aquaphor and Bandage:  After cleaning the wound, apply a thin layer of Aquaphor ointment to the biopsy site. Cover the area with a sterile bandage to protect it from dirt, bacteria, and friction. Change the bandage daily or as needed if it becomes soiled or wet.  3. Continued Care for One Week:  Repeat the cleaning, Aquaphor application, and bandaging process daily for one week following the biopsy procedure. Keeping the wound clean and moist during this initial healing period will help prevent infection and promote optimal healing.  4. Massaging Aquaphor into the Area:  ---After one week, discontinue the use of bandages but continue to apply Aquaphor to the biopsy site. ----Gently massage the Aquaphor into the area using circular motions. ---Massaging the skin helps to promote circulation and prevent the formation of scar tissue.   Additional Tips:  Avoid exposing the biopsy site to direct sunlight during the healing process, as this can cause hyperpigmentation or worsen scarring. If you experience any signs of infection, such as increased redness, swelling, warmth, or drainage from the wound, contact your healthcare provider immediately. Follow any additional instructions provided by your healthcare provider for caring for the biopsy site and managing any discomfort. Conclusion:  Taking proper care of your skin biopsy site is crucial for ensuring optimal healing and minimizing scarring. By following these instructions for cleaning, applying Aquaphor, and massaging the area, you can promote a smooth and successful recovery. If you have any questions or concerns  about caring for your biopsy site, don't hesitate to contact your healthcare provider for guidance.    Due to recent changes in healthcare laws, you may see results of your pathology and/or laboratory studies on MyChart before the doctors have had a chance to review them. We understand that in some  cases there may be results that are confusing or concerning to you. Please understand that not all results are received at the same time and often the doctors may need to interpret multiple results in order to provide you with the best plan of care or course of treatment. Therefore, we ask that you please give [place] 2 business days to thoroughly review all your results before contacting the office for clarification. Should we see a critical lab result, you will be contacted sooner.   If You Need Anything After Your Visit  If you have any questions or concerns for your doctor, please call our main line at [phone] If no one answers, please leave a voicemail as directed and we will return your call as soon as possible. Messages left after 4 pm will be answered the following business day.   You may also send [place] a message via MyChart. We typically respond to MyChart messages within 1-2 business days.  For prescription refills, please ask your pharmacy to contact our office. Our fax number is [phone].  If you have an urgent issue when the clinic is closed that cannot wait until the next business day, you can page your doctor at the number below.    Please note that while we do our best to be available for urgent issues outside of office hours, we are not available 24/7.   If you have an urgent issue and are unable to reach [place], you may choose to seek medical care at your doctor's office, retail clinic, urgent care center, or emergency room.  If you have a medical emergency, please immediately call 911 or go to the emergency department. In the event of inclement weather, please call our main line at [phone] for an update on the status of any delays or closures.  Dermatology Medication Tips: Please keep the boxes that topical medications come in in order to help keep track of the instructions about where and how to use these. Pharmacies typically print the medication instructions only on the  boxes and not directly on the medication tubes.   If your medication is too expensive, please contact our office at [phone] or send [place] a message through MyChart.   We are unable to tell what your co-pay for medications will be in advance as this is different depending on your insurance coverage. However, we may be able to find a substitute medication at lower cost or fill out paperwork to get insurance to cover a needed medication.   If a prior authorization is required to get your medication covered by your insurance company, please allow [place] 1-2 business days to complete this process.  Drug prices often vary depending on where the prescription is filled and some pharmacies may offer cheaper prices.  The website www.goodrx.com contains coupons for medications through different pharmacies. The prices here do not account for what the cost may be with help from insurance (it may be cheaper with your insurance), but the website can give you the price if you did not use any insurance.  - You can print the associated coupon and take it with your prescription to the pharmacy.  - You may also stop by  our office during regular business hours and pick up a GoodRx coupon card.  - If you need your prescription sent electronically to a different pharmacy, notify our office through [facility] or by phone at [phone]

## 2022-10-17 NOTE — Progress Notes (Signed)
New Patient Visit   Subjective  Martin Fitzgerald is a 52 y.o. male who presents for the following: Skin Cancer Screening and Full Body Skin Exam  The patient presents for Total-Body Skin Exam (TBSE) for skin cancer screening and mole check. The patient has spots, moles and lesions to be evaluated, some may be new or changing and the patient may have concern these could be cancer.   Patient states he has possible ring worm located on the face that he would like to have examined. Patient reports the areas have been there for 2 weeks ago and was give Mupirocin which is helping. He reports the area is not bothersome. He states that the areas hasn't spread.  Patient he Hx of BCC on left abdomen around 2003. He has no family history of skin cancer(s).     The following portions of the chart were reviewed this encounter and updated as appropriate: medications, allergies, medical history  Review of Systems:  No other skin or systemic complaints except as noted in HPI or Assessment and Plan.  Objective  Well appearing patient in no apparent distress; mood and affect are within normal limits.  A full examination was performed including scalp, head, eyes, ears, nose, lips, neck, chest, axillae, abdomen, back, buttocks, bilateral upper extremities, bilateral lower extremities, hands, feet, fingers, toes, fingernails, and toenails. All findings within normal limits unless otherwise noted below.   Relevant physical exam findings are noted in the Assessment and Plan.       Right Lower Leg - Anterior 3mm Irregular brown macule with black specks       Right Lower Back 4mm Irregular brown to black macule       Left Abdomen (side) - Upper 1.2 cm x 7mm irregular brown macule         Assessment & Plan   SKIN CANCER SCREENING PERFORMED TODAY.  ACTINIC DAMAGE - Chronic condition, secondary to cumulative UV/sun exposure - diffuse scaly erythematous macules with underlying  dyspigmentation - Recommend daily broad spectrum sunscreen SPF 30+ to sun-exposed areas, reapply every 2 hours as needed.  - Staying in the shade or wearing long sleeves, sun glasses (UVA+UVB protection) and wide brim hats (4-inch brim around the entire circumference of the hat) are also recommended for sun protection.  - Call for new or changing lesions.    LENTIGINES, SEBORRHEIC KERATOSES, HEMANGIOMAS - Benign normal skin lesions - Benign-appearing - Call for any changes  MELANOCYTIC NEVI - Tan-brown and/or pink-flesh-colored symmetric macules and papules - Benign appearing on exam today - Observation - Call clinic for new or changing moles - Recommend daily use of broad spectrum spf 30+ sunscreen to sun-exposed areas.        Neoplasm of uncertain behavior of skin (3) Right Lower Leg - Anterior  Skin / nail biopsy Type of biopsy: tangential   Informed consent: discussed and consent obtained   Timeout: patient name, date of birth, surgical site, and procedure verified   Procedure prep:  Patient was prepped and draped in usual sterile fashion Prep type:  Isopropyl alcohol Anesthesia: the lesion was anesthetized in a standard fashion   Anesthetic:  1% lidocaine w/ epinephrine 1-100,000 buffered w/ 8.4% NaHCO3 Instrument used: DermaBlade   Hemostasis achieved with: aluminum chloride   Outcome: patient tolerated procedure well   Post-procedure details: sterile dressing applied and wound care instructions given   Dressing type: petrolatum gauze and bandage    Specimen 1 - Surgical pathology Differential Diagnosis: DN  Check Margins: No  Right Lower Back  Skin / nail biopsy Type of biopsy: tangential   Informed consent: discussed and consent obtained   Timeout: patient name, date of birth, surgical site, and procedure verified   Procedure prep:  Patient was prepped and draped in usual sterile fashion Prep type:  Isopropyl alcohol Anesthesia: the lesion was anesthetized  in a standard fashion   Anesthetic:  1% lidocaine w/ epinephrine 1-100,000 buffered w/ 8.4% NaHCO3 Instrument used: DermaBlade   Hemostasis achieved with: aluminum chloride   Outcome: patient tolerated procedure well   Post-procedure details: sterile dressing applied and wound care instructions given   Dressing type: petrolatum gauze and bandage    Specimen 2 - Surgical pathology Differential Diagnosis: DN  Check Margins: No  Left Abdomen (side) - Upper  Skin / nail biopsy Type of biopsy: tangential   Informed consent: discussed and consent obtained   Timeout: patient name, date of birth, surgical site, and procedure verified   Procedure prep:  Patient was prepped and draped in usual sterile fashion Prep type:  Isopropyl alcohol Anesthesia: the lesion was anesthetized in a standard fashion   Anesthetic:  1% lidocaine w/ epinephrine 1-100,000 buffered w/ 8.4% NaHCO3 Instrument used: DermaBlade   Hemostasis achieved with: aluminum chloride   Outcome: patient tolerated procedure well   Post-procedure details: sterile dressing applied and wound care instructions given   Dressing type: petrolatum gauze and bandage    Specimen 3 - Surgical pathology Differential Diagnosis: DN  Check Margins: No  Seborrheic keratosis  Multiple benign melanocytic nevi  Lentigines  Cherry angioma  Skin exam for malignant neoplasm    Return in about 1 year (around 10/17/2023) for TBSE.  Owens Shark, CMA, am acting as scribe for [employer], DO.   Documentation: I have reviewed the above documentation for accuracy and completeness, and I agree with the above.  Langston Reusing, DO

## 2022-10-18 DIAGNOSIS — H40013 Open angle with borderline findings, low risk, bilateral: Secondary | ICD-10-CM | POA: Diagnosis not present

## 2022-10-24 NOTE — Progress Notes (Signed)
Please call pt and notify that their bx results showed an abnormal mole that requires a full excision in office with Dr Onalee Hua  Please schedule a surgery in a 12:30pm slot.   Diagnosis 1. 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 Skin , right lower back, shave biopsy JUNCTIONAL DYSPLASTIC MELANOCYTIC NEVUS WITH MODERATE TO SEVERE ATYPIA AND HALO NEVUS EFFECT, MARGIN CLOSE, SEE DESCRIPTION --> SE  3. Skin , left abdomen (side) - upper, shave biopsy MELANOCYTIC NEVUS, COMPOUND TYPE, IRRITATED --> No Tx

## 2022-10-27 ENCOUNTER — Other Ambulatory Visit (HOSPITAL_BASED_OUTPATIENT_CLINIC_OR_DEPARTMENT_OTHER): Payer: Self-pay

## 2022-10-31 ENCOUNTER — Telehealth: Payer: Self-pay

## 2022-10-31 NOTE — Telephone Encounter (Signed)
-----   Message from Langston Reusing sent at 10/24/2022 11:52 AM EDT ----- Please call pt and notify that their bx results showed an abnormal mole that requires a full excision in office with Dr Onalee Hua  Please schedule a surgery in a 12:30pm slot.   Diagnosis 1. 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 Skin , right lower back, shave biopsy JUNCTIONAL DYSPLASTIC MELANOCYTIC NEVUS WITH MODERATE TO SEVERE ATYPIA AND HALO NEVUS EFFECT, MARGIN CLOSE, SEE DESCRIPTION --> SE  3. Skin , left abdomen (side) - upper, shave biopsy MELANOCYTIC NEVUS, COMPOUND TYPE, IRRITATED --> No Tx

## 2022-10-31 NOTE — Telephone Encounter (Signed)
I left a voicemail to give results and schedule procedure

## 2022-11-01 ENCOUNTER — Telehealth: Payer: Self-pay

## 2022-11-01 NOTE — Telephone Encounter (Signed)
-----   Message from Langston Reusing sent at 10/24/2022 11:52 AM EDT ----- Please call pt and notify that their bx results showed an abnormal mole that requires a full excision in office with Dr Onalee Hua  Please schedule a surgery in a 12:30pm slot.   Diagnosis 1. 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 Skin , right lower back, shave biopsy JUNCTIONAL DYSPLASTIC MELANOCYTIC NEVUS WITH MODERATE TO SEVERE ATYPIA AND HALO NEVUS EFFECT, MARGIN CLOSE, SEE DESCRIPTION --> SE  3. Skin , left abdomen (side) - upper, shave biopsy MELANOCYTIC NEVUS, COMPOUND TYPE, IRRITATED --> No Tx

## 2022-11-01 NOTE — Telephone Encounter (Signed)
I left a second voicemail to discuss results.

## 2022-11-07 ENCOUNTER — Telehealth: Payer: Self-pay

## 2022-11-07 NOTE — Telephone Encounter (Signed)
He asked me to report to Dr the occasional blister he gets under his wedding band since 03/2021. It happens when he's working and sweating. I discussed that something could have gotten stuck under the ring and he probably had a reaction or he may be reacting to gold/metal alloy but that its hard to say without seeing it. It has since gone away. I told him he can let us know/upload pictures if it occurs again.

## 2022-11-07 NOTE — Telephone Encounter (Addendum)
He called and results were given. An appointment was scheduled.  ----- Message from Langston Reusing sent at 10/24/2022 11:52 AM EDT ----- Please call pt and notify that their bx results showed an abnormal mole that requires a full excision in office with Dr Onalee Hua  Please schedule a surgery in a 12:30pm slot.   Diagnosis 1. 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 Skin , right lower back, shave biopsy JUNCTIONAL DYSPLASTIC MELANOCYTIC NEVUS WITH MODERATE TO SEVERE ATYPIA AND HALO NEVUS EFFECT, MARGIN CLOSE, SEE DESCRIPTION --> SE  3. Skin , left abdomen (side) - upper, shave biopsy MELANOCYTIC NEVUS, COMPOUND TYPE, IRRITATED --> No Tx

## 2022-11-08 NOTE — Telephone Encounter (Signed)
I sent him a my chart message. He will make an appt when symptoms occur.

## 2022-11-08 NOTE — Telephone Encounter (Signed)
Hi Martin Fitzgerald,  Please call patient and let him know he will need to make an appointment for the area to be evaluated in person if it returns.

## 2022-11-10 ENCOUNTER — Other Ambulatory Visit (HOSPITAL_BASED_OUTPATIENT_CLINIC_OR_DEPARTMENT_OTHER): Payer: Self-pay

## 2022-11-10 DIAGNOSIS — F429 Obsessive-compulsive disorder, unspecified: Secondary | ICD-10-CM | POA: Diagnosis not present

## 2022-11-10 DIAGNOSIS — F902 Attention-deficit hyperactivity disorder, combined type: Secondary | ICD-10-CM | POA: Diagnosis not present

## 2022-11-10 MED ORDER — LISDEXAMFETAMINE DIMESYLATE 30 MG PO CAPS
30.0000 mg | ORAL_CAPSULE | Freq: Every day | ORAL | 0 refills | Status: DC
  Filled 2022-11-10: qty 30, 30d supply, fill #0

## 2022-12-07 ENCOUNTER — Other Ambulatory Visit (HOSPITAL_BASED_OUTPATIENT_CLINIC_OR_DEPARTMENT_OTHER): Payer: Self-pay

## 2022-12-07 ENCOUNTER — Ambulatory Visit: Payer: Commercial Managed Care - PPO | Admitting: Dermatology

## 2022-12-07 DIAGNOSIS — F902 Attention-deficit hyperactivity disorder, combined type: Secondary | ICD-10-CM | POA: Diagnosis not present

## 2022-12-07 DIAGNOSIS — F429 Obsessive-compulsive disorder, unspecified: Secondary | ICD-10-CM | POA: Diagnosis not present

## 2022-12-07 MED ORDER — AUVELITY 45-105 MG PO TBCR: 1.0000 | EXTENDED_RELEASE_TABLET | Freq: Two times a day (BID) | ORAL | 0 refills | Status: DC

## 2022-12-07 MED ORDER — LISDEXAMFETAMINE DIMESYLATE 30 MG PO CAPS
30.0000 mg | ORAL_CAPSULE | Freq: Every day | ORAL | 0 refills | Status: AC
  Filled 2022-12-07 – 2022-12-08 (×2): qty 30, 30d supply, fill #0

## 2022-12-07 MED ORDER — AUVELITY 45-105 MG PO TBCR
1.0000 | EXTENDED_RELEASE_TABLET | Freq: Two times a day (BID) | ORAL | 0 refills | Status: DC
  Filled 2022-12-07 – 2023-03-16 (×3): qty 180, 90d supply, fill #0

## 2022-12-08 ENCOUNTER — Other Ambulatory Visit (HOSPITAL_BASED_OUTPATIENT_CLINIC_OR_DEPARTMENT_OTHER): Payer: Self-pay

## 2023-01-05 ENCOUNTER — Other Ambulatory Visit (HOSPITAL_BASED_OUTPATIENT_CLINIC_OR_DEPARTMENT_OTHER): Payer: Self-pay

## 2023-01-10 ENCOUNTER — Other Ambulatory Visit: Payer: Self-pay

## 2023-01-11 ENCOUNTER — Encounter: Payer: Self-pay | Admitting: Dermatology

## 2023-01-11 ENCOUNTER — Other Ambulatory Visit (HOSPITAL_BASED_OUTPATIENT_CLINIC_OR_DEPARTMENT_OTHER): Payer: Self-pay

## 2023-01-11 ENCOUNTER — Ambulatory Visit (INDEPENDENT_AMBULATORY_CARE_PROVIDER_SITE_OTHER): Payer: Commercial Managed Care - PPO | Admitting: Dermatology

## 2023-01-11 DIAGNOSIS — D239 Other benign neoplasm of skin, unspecified: Secondary | ICD-10-CM

## 2023-01-11 DIAGNOSIS — D225 Melanocytic nevi of trunk: Secondary | ICD-10-CM | POA: Diagnosis not present

## 2023-01-11 DIAGNOSIS — D235 Other benign neoplasm of skin of trunk: Secondary | ICD-10-CM | POA: Diagnosis not present

## 2023-01-11 MED ORDER — MUPIROCIN 2 % EX OINT
1.0000 | TOPICAL_OINTMENT | Freq: Two times a day (BID) | CUTANEOUS | 0 refills | Status: DC
  Filled 2023-01-11: qty 22, 11d supply, fill #0

## 2023-01-11 MED ORDER — DOXYCYCLINE HYCLATE 100 MG PO TABS
100.0000 mg | ORAL_TABLET | Freq: Two times a day (BID) | ORAL | 0 refills | Status: DC
  Filled 2023-01-11: qty 10, 5d supply, fill #0

## 2023-01-11 NOTE — Patient Instructions (Signed)
Wound Care Instructions  On the day following your surgery, you should begin doing daily dressing changes: Remove the old dressing and discard it. Cleanse the wound gently with tap water. This may be done in the shower or by placing a wet gauze pad directly on the wound and letting it soak for several minutes. It is important to gently remove any dried blood from the wound in order to encourage healing. This may be done by gently rolling a moistened Q-tip on the dried blood. Do not pick at the wound. If the wound should start to bleed, continue cleaning the wound, then place a moist gauze pad on the wound and hold pressure for a few minutes.  Make sure you then dry the skin surrounding the wound completely or the tape will not stick to the skin. Do not use cotton balls on the wound. After the wound is clean and dry, apply the ointment gently with a Q-tip. Cut a non-stick pad to fit the size of the wound. Lay the pad flush to the wound. If the wound is draining, you may want to reinforce it with a small amount of gauze on top of the non-stick pad for a little added compression to the area. Use the tape to seal the area completely. Select from the following with respect to your individual situation: If your wound has been stitched closed: continue the above steps 1-8 at least daily until your sutures are removed. If your wound has been left open to heal: continue steps 1-8 at least daily for the first 3-4 weeks. We would like for you to take a few extra precautions for at least the next week. Sleep with your head elevated on pillows if our wound is on your head. Do not bend over or lift heavy items to reduce the chance of elevated blood pressure to the wound Do not participate in particularly strenuous activities.   Below is a list of dressing supplies you might need.  Cotton-tipped applicators - Q-tips Gauze pads (2x2 and/or 4x4) - All-Purpose Sponges Non-stick dressing material - Telfa Tape -  Paper or Hypafix New and clean tube of petroleum jelly - Vaseline    Comments on Post-Operative Period Slight swelling and redness often appear around the wound. This is normal and will disappear within several days following the surgery. The healing wound will drain a brownish-red-yellow discharge during healing. This is a normal phase of wound healing. As the wound begins to heal, the drainage may increase in amount. Again, this drainage is normal. Notify us if the drainage becomes persistently bloody, excessively swollen, or intensely painful or develops a foul odor or red streaks.  If you should experience mild discomfort during the healing phase, you may take an aspirin-free medication such as Tylenol (acetaminophen). Notify us if the discomfort is severe or persistent. Avoid alcoholic beverages when taking pain medicine.  In Case of Wound Hemorrhage A wound hemorrhage is when the bandage suddenly becomes soaked with bright red blood and flows profusely. If this happens, sit down or lie down with your head elevated. If the wound has a dressing on it, do not remove the dressing. Apply pressure to the existing gauze. If the wound is not covered, use a gauze pad to apply pressure and continue applying the pressure for 20 minutes without peeking. DO NOT COVER THE WOUND WITH A LARGE TOWEL OR WASH CLOTH. Release your hand from the wound site but do not remove the dressing. If the bleeding has stopped,  gently clean around the wound. Leave the dressing in place for 24 hours if possible. This wait time allows the blood vessels to close off so that you do not spark a new round of bleeding by disrupting the newly clotted blood vessels with an immediate dressing change. If the bleeding does not subside, continue to hold pressure. If matters are out of your control, contact an After Hours clinic or go to the Emergency Room.    Important Information  Due to recent changes in healthcare laws, you may see  results of your pathology and/or laboratory studies on MyChart before the doctors have had a chance to review them. We understand that in some cases there may be results that are confusing or concerning to you. Please understand that not all results are received at the same time and often the doctors may need to interpret multiple results in order to provide you with the best plan of care or course of treatment. Therefore, we ask that you please give [place] 2 business days to thoroughly review all your results before contacting the office for clarification. Should we see a critical lab result, you will be contacted sooner.   If You Need Anything After Your Visit  If you have any questions or concerns for your doctor, please call our main line at [phone] If no one answers, please leave a voicemail as directed and we will return your call as soon as possible. Messages left after 4 pm will be answered the following business day.   You may also send [place] a message via MyChart. We typically respond to MyChart messages within 1-2 business days.  For prescription refills, please ask your pharmacy to contact our office. Our fax number is [phone].  If you have an urgent issue when the clinic is closed that cannot wait until the next business day, you can page your doctor at the number below.    Please note that while we do our best to be available for urgent issues outside of office hours, we are not available 24/7.   If you have an urgent issue and are unable to reach [place], you may choose to seek medical care at your doctor's office, retail clinic, urgent care center, or emergency room.  If you have a medical emergency, please immediately call 911 or go to the emergency department. In the event of inclement weather, please call our main line at [phone] for an update on the status of any delays or closures.  Dermatology Medication Tips: Please keep the boxes that topical medications come in in  order to help keep track of the instructions about where and how to use these. Pharmacies typically print the medication instructions only on the boxes and not directly on the medication tubes.   If your medication is too expensive, please contact our office at [phone] or send [place] a message through MyChart.   We are unable to tell what your co-pay for medications will be in advance as this is different depending on your insurance coverage. However, we may be able to find a substitute medication at lower cost or fill out paperwork to get insurance to cover a needed medication.   If a prior authorization is required to get your medication covered by your insurance company, please allow [place] 1-2 business days to complete this process.  Drug prices often vary depending on where the prescription is filled and some pharmacies may offer cheaper prices.  The website www.goodrx.com contains coupons for medications through  different pharmacies. The prices here do not account for what the cost may be with help from insurance (it may be cheaper with your insurance), but the website can give you the price if you did not use any insurance.  - You can print the associated coupon and take it with your prescription to the pharmacy.  - You may also stop by our office during regular business hours and pick up a GoodRx coupon card.  - If you need your prescription sent electronically to a different pharmacy, notify our office through [facility] or by phone at [phone]

## 2023-01-11 NOTE — Progress Notes (Signed)
   Follow-Up Visit   Subjective  Martin Fitzgerald is a 52 y.o. male who presents for the following: Excision of Biopsy proven moderate-severe dysplastic nevus of right lower back  The following portions of the chart were reviewed this encounter and updated as appropriate: medications, allergies, medical history  Review of Systems:  No other skin or systemic complaints except as noted in HPI or Assessment and Plan.  Objective  Well appearing patient in no apparent distress; mood and affect are within normal limits.  A focused examination was performed of the following areas: Back  Relevant physical exam findings are noted in the Assessment and Plan.   Right Lower Back Healing biopsy site    Assessment & Plan    Dysplastic nevus Right Lower Back  Skin excision - Right Lower Back  Lesion length (cm):  0.5 Lesion width (cm):  0.5 Margin per side (cm):  0.5 Total excision diameter (cm):  1.5 Informed consent: discussed and consent obtained   Timeout: patient name, date of birth, surgical site, and procedure verified   Procedure prep:  Patient was prepped and draped in usual sterile fashion Prep type:  Isopropyl alcohol and povidone-iodine Anesthesia: the lesion was anesthetized in a standard fashion   Anesthetic:  1% lidocaine w/ epinephrine 1-100,000 buffered w/ 8.4% NaHCO3 Instrument used: #15 blade   Hemostasis achieved with: pressure   Hemostasis achieved with comment:  Electrocautery Outcome: patient tolerated procedure well with no complications   Post-procedure details: sterile dressing applied and wound care instructions given   Dressing type: bandage and pressure dressing (mupirocin)    Skin repair - Right Lower Back Complexity:  Complex Reason for type of repair: reduce tension to allow closure, reduce the risk of dehiscence, infection, and necrosis, reduce subcutaneous dead space and avoid a hematoma, allow closure of the large defect, preserve normal anatomy,  preserve normal anatomical and functional relationships and enhance both functionality and cosmetic results   Undermining: area extensively undermined   Undermining comment:  Undermining defect 1.0 cm Subcutaneous layers (deep stitches):  Suture size:  3-0 Suture type: Vicryl (polyglactin 910)   Subcutaneous suture technique: inverted dermal. Fine/surface layer approximation (top stitches):  Suture size:  3-0 Suture type: nylon   Stitches: simple running   Suture removal (days):  7 Hemostasis achieved with: suture and pressure Outcome: patient tolerated procedure well with no complications   Post-procedure details: sterile dressing applied and wound care instructions given   Dressing type: bandage and pressure dressing (mupirocin)    Specimen 1 - Surgical pathology Differential Diagnosis: Biopsy proven moderate-severe dysplastic nevus Check Margins: Yes [id number]     Return in about 2 weeks (around 01/25/2023) for suture removal.  I, Joanie Coddington, CMA, am acting as scribe for [employer], DO .   Documentation: I have reviewed the above documentation for accuracy and completeness, and I agree with the above.  Langston Reusing, DO

## 2023-01-15 LAB — SURGICAL PATHOLOGY

## 2023-01-16 NOTE — Progress Notes (Signed)
Surgical excision pathology report was reviewed and showed clear margins.  No additional treatment required.

## 2023-01-17 ENCOUNTER — Encounter: Payer: Self-pay | Admitting: Dermatology

## 2023-01-18 NOTE — Telephone Encounter (Signed)
Please let patient know that it's common for ittiation to occur under rings.  He can try applying hydrocortisone for a few days and if that does not help then he can make an appointment for further evaluation.

## 2023-01-25 ENCOUNTER — Encounter: Payer: Self-pay | Admitting: Dermatology

## 2023-01-25 ENCOUNTER — Ambulatory Visit: Payer: Commercial Managed Care - PPO | Admitting: Dermatology

## 2023-01-25 DIAGNOSIS — Z4802 Encounter for removal of sutures: Secondary | ICD-10-CM

## 2023-01-25 NOTE — Progress Notes (Cosign Needed)
Patient presents for suture removal. The wound is well healed without signs of infection. The sutures are removed. Wound care and activity instructions given. Return prn.   Jaylenne Hamelin, CMA

## 2023-01-31 ENCOUNTER — Other Ambulatory Visit (HOSPITAL_BASED_OUTPATIENT_CLINIC_OR_DEPARTMENT_OTHER): Payer: Self-pay

## 2023-01-31 ENCOUNTER — Telehealth: Payer: Commercial Managed Care - PPO | Admitting: Physician Assistant

## 2023-01-31 DIAGNOSIS — U071 COVID-19: Secondary | ICD-10-CM | POA: Diagnosis not present

## 2023-01-31 DIAGNOSIS — B999 Unspecified infectious disease: Secondary | ICD-10-CM

## 2023-01-31 MED ORDER — AZITHROMYCIN 250 MG PO TABS
ORAL_TABLET | ORAL | 0 refills | Status: AC
  Filled 2023-01-31: qty 6, 5d supply, fill #0

## 2023-01-31 MED ORDER — BENZONATATE 100 MG PO CAPS
100.0000 mg | ORAL_CAPSULE | Freq: Three times a day (TID) | ORAL | 0 refills | Status: AC | PRN
Start: 2023-01-31 — End: ?
  Filled 2023-01-31: qty 30, 10d supply, fill #0

## 2023-01-31 MED ORDER — ALBUTEROL SULFATE HFA 108 (90 BASE) MCG/ACT IN AERS
2.0000 | INHALATION_SPRAY | Freq: Four times a day (QID) | RESPIRATORY_TRACT | 0 refills | Status: AC | PRN
Start: 2023-01-31 — End: ?
  Filled 2023-01-31: qty 6.7, 25d supply, fill #0

## 2023-01-31 NOTE — Patient Instructions (Signed)
Martin Fitzgerald, thank you for joining [name], PA-C for today's virtual visit.  While this provider is not your primary care provider (PCP), if your PCP is located in our provider database this encounter information will be shared with them immediately following your visit.   A [facility] MyChart account gives you access to today's visit and all your visits, tests, and labs performed at [facility] " click here if you don't have a [facility] MyChart account or go to [email or web address]  Consent: (Patient) Martin Fitzgerald provided verbal consent for this virtual visit at the beginning of the encounter.  Current Medications:  Current Outpatient Medications:    albuterol (VENTOLIN HFA) 108 (90 Base) MCG/ACT inhaler, Inhale 2 puffs into the lungs every 6 (six) hours as needed for wheezing or shortness of breath., Disp: 8 g, Rfl: 0   azithromycin (ZITHROMAX) 250 MG tablet, Take 2 tablets on day 1, then 1 tablet daily on days 2 through 5, Disp: 6 tablet, Rfl: 0   benzonatate (TESSALON) 100 MG capsule, Take 1 capsule (100 mg total) by mouth 3 (three) times daily as needed for cough., Disp: 30 capsule, Rfl: 0   Dextromethorphan-buPROPion ER (AUVELITY) 45-105 MG TBCR, Take 1 tablet by mouth 2 (two) times daily., Disp: 180 tablet, Rfl: 0   Fluvoxamine Maleate 100 MG CP24, Take 1 capsule by mouth at bedtime., Disp: 90 capsule, Rfl: 0   lisdexamfetamine (VYVANSE) 30 MG capsule, Take 1 capsule (30 mg total) by mouth daily., Disp: 30 capsule, Rfl: 0   mupirocin ointment (BACTROBAN) 2 %, Apply 1 Application topically 2 (two) times daily., Disp: 22 g, Rfl: 0   valACYclovir (VALTREX) 500 MG tablet, Take 1 tablet (500 mg total) by mouth daily., Disp: 90 tablet, Rfl: 4   Medications ordered in this encounter:  Meds ordered this encounter  Medications   benzonatate (TESSALON) 100 MG capsule    Sig: Take 1 capsule (100 mg total) by mouth 3 (three) times daily as needed for cough.     Dispense:  30 capsule    Refill:  0    Order Specific Question:   Supervising Provider    Answer:   Loreli Dollar   albuterol (VENTOLIN HFA) 108 (90 Base) MCG/ACT inhaler    Sig: Inhale 2 puffs into the lungs every 6 (six) hours as needed for wheezing or shortness of breath.    Dispense:  8 g    Refill:  0    Order Specific Question:   Supervising Provider    Answer:   Merrilee Jansky [[id number]]   azithromycin (ZITHROMAX) 250 MG tablet    Sig: Take 2 tablets on day 1, then 1 tablet daily on days 2 through 5    Dispense:  6 tablet    Refill:  0    Order Specific Question:   Supervising Provider    Answer:   Merrilee Jansky [[id number]]     *If you need refills on other medications prior to your next appointment, please contact your pharmacy*  Follow-Up: Call back or seek an in-person evaluation if the symptoms worsen or if the condition fails to improve as anticipated.  [facility] [phone]  Care Instructions: Please keep well-hydrated and get plenty of rest. Start a saline nasal rinse to flush out your nasal passages. You can use plain Mucinex to help thin congestion. If you have a humidifier, running in the bedroom at night. I want you to start  OTC vitamin D3 1000 units daily, vitamin C 1000 mg daily, and a zinc supplement. Please take prescribed medications as directed.      Isolation Instructions: You are to isolate at home until you have been fever free for at least 24 hours without a fever-reducing medication, and symptoms have been steadily improving for 24 hours. At that time,  you can end isolation but need to mask for an additional 5 days.   If you must be around other household members who do not have symptoms, you need to make sure that both you and the family members are masking consistently with a high-quality mask.  If you note any worsening of symptoms despite treatment, please seek an in-person evaluation ASAP. If you note any  significant shortness of breath or any chest pain, please seek ER evaluation. Please do not delay care!   COVID-19: What to Do if You Are Sick If you test positive and are an older adult or someone who is at high risk of getting very sick from COVID-19, treatment may be available. Contact a healthcare provider right away after a positive test to determine if you are eligible, even if your symptoms are mild right now. You can also visit a Test to Treat location and, if eligible, receive a prescription from a provider. Don't delay: Treatment must be started within the first few days to be effective. If you have a fever, cough, or other symptoms, you might have COVID-19. Most people have mild illness and are able to recover at home. If you are sick: Keep track of your symptoms. If you have an emergency warning sign (including trouble breathing), call 911. Steps to help prevent the spread of COVID-19 if you are sick If you are sick with COVID-19 or think you might have COVID-19, follow the steps below to care for yourself and to help protect other people in your home and community. Stay home except to get medical care Stay home. Most people with COVID-19 have mild illness and can recover at home without medical care. Do not leave your home, except to get medical care. Do not visit public areas and do not go to places where you are unable to wear a mask. Take care of yourself. Get rest and stay hydrated. Take over-the-counter medicines, such as acetaminophen, to help you feel better. Stay in touch with your doctor. Call before you get medical care. Be sure to get care if you have trouble breathing, or have any other emergency warning signs, or if you think it is an emergency. Avoid public transportation, ride-sharing, or taxis if possible. Get tested If you have symptoms of COVID-19, get tested. While waiting for test results, stay away from others, including staying apart from those living in your  household. Get tested as soon as possible after your symptoms start. Treatments may be available for people with COVID-19 who are at risk for becoming very sick. Don't delay: Treatment must be started early to be effective--some treatments must begin within 5 days of your first symptoms. Contact your healthcare provider right away if your test result is positive to determine if you are eligible. Self-tests are one of several options for testing for the virus that causes COVID-19 and may be more convenient than laboratory-based tests and point-of-care tests. Ask your healthcare provider or your local health department if you need help interpreting your test results. You can visit your state, tribal, local, and territorial health department's website to look for the latest local information  on testing sites. Separate yourself from other people As much as possible, stay in a specific room and away from other people and pets in your home. If possible, you should use a separate bathroom. If you need to be around other people or animals in or outside of the home, wear a well-fitting mask. Tell your close contacts that they may have been exposed to COVID-19. An infected person can spread COVID-19 starting 48 hours (or 2 days) before the person has any symptoms or tests positive. By letting your close contacts know they may have been exposed to COVID-19, you are helping to protect everyone. See COVID-19 and Animals if you have questions about pets. If you are diagnosed with COVID-19, someone from the health department may call you. Answer the call to slow the spread. Monitor your symptoms Symptoms of COVID-19 include fever, cough, or other symptoms. Follow care instructions from your healthcare provider and local health department. Your local health authorities may give instructions on checking your symptoms and reporting information. When to seek emergency medical attention Look for emergency warning signs*  for COVID-19. If someone is showing any of these signs, seek emergency medical care immediately: Trouble breathing Persistent pain or pressure in the chest New confusion Inability to wake or stay awake Pale, gray, or blue-colored skin, lips, or nail beds, depending on skin tone *This list is not all possible symptoms. Please call your medical provider for any other symptoms that are severe or concerning to you. Call 911 or call ahead to your local emergency facility: Notify the operator that you are seeking care for someone who has or may have COVID-19. Call ahead before visiting your doctor Call ahead. Many medical visits for routine care are being postponed or done by phone or telemedicine. If you have a medical appointment that cannot be postponed, call your doctor's office, and tell them you have or may have COVID-19. This will help the office protect themselves and other patients. If you are sick, wear a well-fitting mask You should wear a mask if you must be around other people or animals, including pets (even at home). Wear a mask with the best fit, protection, and comfort for you. You don't need to wear the mask if you are alone. If you can't put on a mask (because of trouble breathing, for example), cover your coughs and sneezes in some other way. Try to stay at least 6 feet away from other people. This will help protect the people around you. Masks should not be placed on young children under age 40 years, anyone who has trouble breathing, or anyone who is not able to remove the mask without help. Cover your coughs and sneezes Cover your mouth and nose with a tissue when you cough or sneeze. Throw away used tissues in a lined trash can. Immediately wash your hands with soap and water for at least 20 seconds. If soap and water are not available, clean your hands with an alcohol-based hand sanitizer that contains at least 60% alcohol. Clean your hands often Wash your hands often with soap  and water for at least 20 seconds. This is especially important after blowing your nose, coughing, or sneezing; going to the bathroom; and before eating or preparing food. Use hand sanitizer if soap and water are not available. Use an alcohol-based hand sanitizer with at least 60% alcohol, covering all surfaces of your hands and rubbing them together until they feel dry. Soap and water are the best option, especially if  hands are visibly dirty. Avoid touching your eyes, nose, and mouth with unwashed hands. Handwashing Tips Avoid sharing personal household items Do not share dishes, drinking glasses, cups, eating utensils, towels, or bedding with other people in your home. Wash these items thoroughly after using them with soap and water or put in the dishwasher. Clean surfaces in your home regularly Clean and disinfect high-touch surfaces (for example, doorknobs, tables, handles, light switches, and countertops) in your "sick room" and bathroom. In shared spaces, you should clean and disinfect surfaces and items after each use by the person who is ill. If you are sick and cannot clean, a caregiver or other person should only clean and disinfect the area around you (such as your bedroom and bathroom) on an as needed basis. Your caregiver/other person should wait as long as possible (at least several hours) and wear a mask before entering, cleaning, and disinfecting shared spaces that you use. Clean and disinfect areas that may have blood, stool, or body fluids on them. Use household cleaners and disinfectants. Clean visible dirty surfaces with household cleaners containing soap or detergent. Then, use a household disinfectant. Use a product from [employer] List N: Disinfectants for Coronavirus (COVID-19). Be sure to follow the instructions on the label to ensure safe and effective use of the product. Many products recommend keeping the surface wet with a disinfectant for a certain period of time (look at  "contact time" on the product label). You may also need to wear personal protective equipment, such as gloves, depending on the directions on the product label. Immediately after disinfecting, wash your hands with soap and water for 20 seconds. For completed guidance on cleaning and disinfecting your home, visit Complete Disinfection Guidance. Take steps to improve ventilation at home Improve ventilation (air flow) at home to help prevent from spreading COVID-19 to other people in your household. Clear out COVID-19 virus particles in the air by opening windows, using air filters, and turning on fans in your home. Use this interactive tool to learn how to improve air flow in your home. When you can be around others after being sick with COVID-19 Deciding when you can be around others is different for different situations. Find out when you can safely end home isolation. For any additional questions about your care, contact your healthcare provider or state or local health department. 06/22/2020 Content source: [facility] for Immunization and Respiratory Diseases ([facility]), Division of Viral Diseases This information is not intended to replace advice given to you by your health care provider. Make sure you discuss any questions you have with your health care provider. Document Revised: 08/05/2020 Document Reviewed: 08/05/2020 Elsevier Patient Education  2022 [employer].   If you have been instructed to have an in-person evaluation today at a local Urgent Care facility, please use the link below. It will take you to a list of all of our available [facility], including address, phone number and hours of operation. Please do not delay care.  [facility]  If you or a family member do not have a primary care provider, use the link below to schedule a visit and establish care. When you choose a [facility] primary care physician or advanced practice provider, you gain a  long-term partner in health. Find a Primary Care Provider  Learn more about [facility]'s in-office and virtual care options: [facility] Get Care Now

## 2023-01-31 NOTE — Progress Notes (Signed)
Virtual Visit Consent   Martin Fitzgerald, you are scheduled for a virtual visit with a  provider today. Just as with appointments in the office, your consent must be obtained to participate. Your consent will be active for this visit and any virtual visit you may have with one of our providers in the next 365 days. If you have a MyChart account, a copy of this consent can be sent to you electronically.  As this is a virtual visit, video technology does not allow for your provider to perform a traditional examination. This may limit your provider's ability to fully assess your condition. If your provider identifies any concerns that need to be evaluated in person or the need to arrange testing (such as labs, EKG, etc.), we will make arrangements to do so. Although advances in technology are sophisticated, we cannot ensure that it will always work on either your end or our end. If the connection with a video visit is poor, the visit may have to be switched to a telephone visit. With either a video or telephone visit, we are not always able to ensure that we have a secure connection.  By engaging in this virtual visit, you consent to the provision of healthcare and authorize for your insurance to be billed (if applicable) for the services provided during this visit. Depending on your insurance coverage, you may receive a charge related to this service.  I need to obtain your verbal consent now. Are you willing to proceed with your visit today? Martin Fitzgerald has provided verbal consent on 01/31/2023 for a virtual visit (video or telephone). [name], [place]  Date: 01/31/2023 10:45 AM  Virtual Visit via Video Note   I, [name], connected with  Martin Fitzgerald  ([id number], 1971-02-13) on 01/31/23 at 10:45 AM EDT by a video-enabled telemedicine application and verified that I am speaking with the correct person using two identifiers.  Location: Patient: Virtual Visit Location  Patient: Home Provider: Virtual Visit Location Provider: Home Office   I discussed the limitations of evaluation and management by telemedicine and the availability of in person appointments. The patient expressed understanding and agreed to proceed.    History of Present Illness: Martin Fitzgerald is a 52 y.o. who identifies as a male who was assigned male at birth, and is being seen today for lingering COVID symptoms. Patient endorses last Wednesday -- sore throat, described as a scratch/tickle. Thursday -- went to pharmacy to get cold medicine. Was recommended to take a COVID test by the pharmacist. As such he took the test and found out was positive for COVID-19. Since then has noted continuing to test positive daily for COVID.   Notes is still experiencing some sweats/clammy sensation, persistent cough and irritated throat. Still with some fatigue. Denies chest tightness but notes gets winded with exertion. Questions nighttime fevers. Denies GI symptoms. Cough is now productive again as of today.      HPI: HPI  Problems:  Patient Active Problem List   Diagnosis Date Noted   Hypersomnolence 11/02/2021    Allergies: No Known Allergies Medications:  Current Outpatient Medications:    Dextromethorphan-buPROPion ER (AUVELITY) 45-105 MG TBCR, Take 1 tablet by mouth 2 (two) times daily., Disp: 180 tablet, Rfl: 0   Fluvoxamine Maleate 100 MG CP24, Take 1 capsule by mouth at bedtime., Disp: 90 capsule, Rfl: 0   lisdexamfetamine (VYVANSE) 30 MG capsule, Take 1 capsule (30 mg total) by mouth daily., Disp: 30  capsule, Rfl: 0   mupirocin ointment (BACTROBAN) 2 %, Apply 1 Application topically 2 (two) times daily., Disp: 22 g, Rfl: 0   valACYclovir (VALTREX) 500 MG tablet, Take 1 tablet (500 mg total) by mouth daily., Disp: 90 tablet, Rfl: 4  Observations/Objective: Patient is well-developed, well-nourished in no acute distress.  Resting comfortably at home.  Head is normocephalic, atraumatic.  No  labored breathing. Speech is clear and coherent with logical content.  Patient is alert and oriented at baseline.   Assessment and Plan: 1. COVID-19  Out of window for antiviral. Concern fro secondary bronchitis Supportive measures, OTC medications and vitamin regimen reviewed. Tessalon, Albuterol and Azithromycin per orders. Quarantine reviewed in detail. Strict ER precautions discussed with patient.    Follow Up Instructions: I discussed the assessment and treatment plan with the patient. The patient was provided an opportunity to ask questions and all were answered. The patient agreed with the plan and demonstrated an understanding of the instructions.  A copy of instructions were sent to the patient via MyChart unless otherwise noted below.   The patient was advised to call back or seek an in-person evaluation if the symptoms worsen or if the condition fails to improve as anticipated.    [name], PA-C

## 2023-02-01 ENCOUNTER — Encounter: Payer: Self-pay | Admitting: Physician Assistant

## 2023-03-16 ENCOUNTER — Encounter: Payer: Self-pay | Admitting: Dermatology

## 2023-03-16 ENCOUNTER — Other Ambulatory Visit (HOSPITAL_BASED_OUTPATIENT_CLINIC_OR_DEPARTMENT_OTHER): Payer: Self-pay

## 2023-03-16 MED ORDER — VALACYCLOVIR HCL 500 MG PO TABS
500.0000 mg | ORAL_TABLET | Freq: Every day | ORAL | 4 refills | Status: AC
  Filled 2023-03-16 – 2023-03-23 (×2): qty 90, 90d supply, fill #0
  Filled 2023-07-12: qty 90, 90d supply, fill #1

## 2023-03-19 NOTE — Telephone Encounter (Signed)
He would need to come in for me to check.  He can be added in at 8am or 4pm where I don't have a double book already.

## 2023-03-19 NOTE — Telephone Encounter (Signed)
Thanks

## 2023-03-22 ENCOUNTER — Ambulatory Visit: Payer: Commercial Managed Care - PPO | Admitting: Dermatology

## 2023-03-22 ENCOUNTER — Other Ambulatory Visit (HOSPITAL_BASED_OUTPATIENT_CLINIC_OR_DEPARTMENT_OTHER): Payer: Self-pay

## 2023-03-22 MED ORDER — BOOSTRIX 5-2.5-18.5 LF-MCG/0.5 IM SUSY
0.5000 mL | PREFILLED_SYRINGE | Freq: Once | INTRAMUSCULAR | 0 refills | Status: AC
  Filled 2023-03-22: qty 0.5, 1d supply, fill #0

## 2023-03-23 ENCOUNTER — Other Ambulatory Visit (HOSPITAL_BASED_OUTPATIENT_CLINIC_OR_DEPARTMENT_OTHER): Payer: Self-pay

## 2023-04-02 ENCOUNTER — Other Ambulatory Visit (HOSPITAL_BASED_OUTPATIENT_CLINIC_OR_DEPARTMENT_OTHER): Payer: Self-pay

## 2023-04-17 ENCOUNTER — Other Ambulatory Visit (HOSPITAL_BASED_OUTPATIENT_CLINIC_OR_DEPARTMENT_OTHER): Payer: Self-pay

## 2023-04-18 ENCOUNTER — Ambulatory Visit: Payer: Self-pay

## 2023-04-18 ENCOUNTER — Other Ambulatory Visit: Payer: Self-pay | Admitting: Nurse Practitioner

## 2023-04-18 DIAGNOSIS — Z021 Encounter for pre-employment examination: Secondary | ICD-10-CM

## 2023-05-14 NOTE — Telephone Encounter (Signed)
 Hi Martin Fitzgerald,  I'm forwarding you this message as the pt states they're canceling their next appointment.

## 2023-05-28 ENCOUNTER — Ambulatory Visit: Payer: Commercial Managed Care - PPO | Admitting: Dermatology

## 2023-06-13 ENCOUNTER — Other Ambulatory Visit (HOSPITAL_BASED_OUTPATIENT_CLINIC_OR_DEPARTMENT_OTHER): Payer: Self-pay

## 2023-07-12 ENCOUNTER — Other Ambulatory Visit (HOSPITAL_BASED_OUTPATIENT_CLINIC_OR_DEPARTMENT_OTHER): Payer: Self-pay

## 2023-07-13 ENCOUNTER — Other Ambulatory Visit (HOSPITAL_BASED_OUTPATIENT_CLINIC_OR_DEPARTMENT_OTHER): Payer: Self-pay

## 2023-07-13 ENCOUNTER — Other Ambulatory Visit: Payer: Self-pay

## 2023-07-17 ENCOUNTER — Emergency Department (HOSPITAL_BASED_OUTPATIENT_CLINIC_OR_DEPARTMENT_OTHER)
Admission: EM | Admit: 2023-07-17 | Discharge: 2023-07-17 | Disposition: A | Discharge status: Home / Self Care | Attending: Emergency Medicine | Admitting: Emergency Medicine

## 2023-07-17 ENCOUNTER — Other Ambulatory Visit: Payer: Self-pay

## 2023-07-17 ENCOUNTER — Emergency Department (HOSPITAL_BASED_OUTPATIENT_CLINIC_OR_DEPARTMENT_OTHER): Admitting: Radiology

## 2023-07-17 DIAGNOSIS — Y92410 Unspecified street and highway as the place of occurrence of the external cause: Secondary | ICD-10-CM | POA: Diagnosis not present

## 2023-07-17 DIAGNOSIS — M25512 Pain in left shoulder: Secondary | ICD-10-CM | POA: Diagnosis present

## 2023-07-17 DIAGNOSIS — S5002XA Contusion of left elbow, initial encounter: Secondary | ICD-10-CM | POA: Diagnosis not present

## 2023-07-17 DIAGNOSIS — S46912A Strain of unspecified muscle, fascia and tendon at shoulder and upper arm level, left arm, initial encounter: Secondary | ICD-10-CM | POA: Insufficient documentation

## 2023-07-17 MED ORDER — CYCLOBENZAPRINE HCL 10 MG PO TABS
10.0000 mg | ORAL_TABLET | Freq: Two times a day (BID) | ORAL | 0 refills | Status: DC | PRN
  Filled 2023-07-17 – 2023-08-07 (×2): qty 12, 6d supply, fill #0

## 2023-07-17 NOTE — Discharge Instructions (Signed)
 Follow up with PCP or orthopedics. Take Motrin and Tylenol as needed as directed. Take Flexeril as needed for tight muscles. Do not drive or operate machinery while taking this medication.   Warm compresses to sore muscles, gentle range of motion exercises.
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
## 2023-07-18 ENCOUNTER — Other Ambulatory Visit (HOSPITAL_BASED_OUTPATIENT_CLINIC_OR_DEPARTMENT_OTHER): Payer: Self-pay

## 2023-07-28 ENCOUNTER — Other Ambulatory Visit (HOSPITAL_BASED_OUTPATIENT_CLINIC_OR_DEPARTMENT_OTHER): Payer: Self-pay

## 2023-08-01 ENCOUNTER — Other Ambulatory Visit (HOSPITAL_BASED_OUTPATIENT_CLINIC_OR_DEPARTMENT_OTHER): Payer: Self-pay

## 2023-08-07 ENCOUNTER — Other Ambulatory Visit (HOSPITAL_BASED_OUTPATIENT_CLINIC_OR_DEPARTMENT_OTHER): Payer: Self-pay

## 2023-08-09 ENCOUNTER — Other Ambulatory Visit (HOSPITAL_BASED_OUTPATIENT_CLINIC_OR_DEPARTMENT_OTHER): Payer: Self-pay

## 2023-08-09 MED ORDER — NAPROXEN 500 MG PO TABS
500.0000 mg | ORAL_TABLET | Freq: Two times a day (BID) | ORAL | 0 refills | Status: AC
  Filled 2023-08-09: qty 28, 14d supply, fill #0

## 2023-08-11 ENCOUNTER — Other Ambulatory Visit (HOSPITAL_COMMUNITY): Payer: Self-pay

## 2023-08-28 ENCOUNTER — Other Ambulatory Visit (HOSPITAL_BASED_OUTPATIENT_CLINIC_OR_DEPARTMENT_OTHER): Payer: Self-pay

## 2023-09-18 ENCOUNTER — Other Ambulatory Visit (HOSPITAL_BASED_OUTPATIENT_CLINIC_OR_DEPARTMENT_OTHER): Payer: Self-pay

## 2023-09-18 MED ORDER — PREDNISONE 20 MG PO TABS
ORAL_TABLET | ORAL | 0 refills | Status: DC
  Filled 2023-09-18: qty 14, 9d supply, fill #0

## 2023-09-22 ENCOUNTER — Other Ambulatory Visit (HOSPITAL_BASED_OUTPATIENT_CLINIC_OR_DEPARTMENT_OTHER): Payer: Self-pay

## 2023-09-22 MED ORDER — PREDNISONE 20 MG PO TABS
ORAL_TABLET | ORAL | 0 refills | Status: AC
  Filled 2023-09-22: qty 18, 9d supply, fill #0

## 2023-10-01 ENCOUNTER — Other Ambulatory Visit (HOSPITAL_BASED_OUTPATIENT_CLINIC_OR_DEPARTMENT_OTHER): Payer: Self-pay

## 2023-10-01 MED ORDER — LISDEXAMFETAMINE DIMESYLATE 30 MG PO CAPS
30.0000 mg | ORAL_CAPSULE | Freq: Every morning | ORAL | 0 refills | Status: DC
  Filled 2023-10-01 – 2024-01-15 (×2): qty 30, 30d supply, fill #0

## 2023-10-02 ENCOUNTER — Other Ambulatory Visit (HOSPITAL_BASED_OUTPATIENT_CLINIC_OR_DEPARTMENT_OTHER): Payer: Self-pay

## 2023-10-17 ENCOUNTER — Ambulatory Visit: Payer: Commercial Managed Care - PPO | Admitting: Dermatology

## 2023-10-18 ENCOUNTER — Ambulatory Visit: Payer: Commercial Managed Care - PPO | Admitting: Dermatology

## 2023-10-27 ENCOUNTER — Other Ambulatory Visit (HOSPITAL_BASED_OUTPATIENT_CLINIC_OR_DEPARTMENT_OTHER): Payer: Self-pay

## 2023-10-27 MED ORDER — NAPROXEN 500 MG PO TABS
500.0000 mg | ORAL_TABLET | Freq: Two times a day (BID) | ORAL | 0 refills | Status: AC
  Filled 2023-10-27: qty 28, 14d supply, fill #0

## 2023-10-29 ENCOUNTER — Other Ambulatory Visit (HOSPITAL_BASED_OUTPATIENT_CLINIC_OR_DEPARTMENT_OTHER): Payer: Self-pay

## 2023-11-03 ENCOUNTER — Other Ambulatory Visit (HOSPITAL_BASED_OUTPATIENT_CLINIC_OR_DEPARTMENT_OTHER): Payer: Self-pay

## 2023-11-03 MED ORDER — LISDEXAMFETAMINE DIMESYLATE 30 MG PO CAPS
30.0000 mg | ORAL_CAPSULE | Freq: Every morning | ORAL | 0 refills | Status: AC
  Filled 2023-11-03: qty 30, 30d supply, fill #0

## 2023-12-04 ENCOUNTER — Other Ambulatory Visit (HOSPITAL_BASED_OUTPATIENT_CLINIC_OR_DEPARTMENT_OTHER): Payer: Self-pay

## 2023-12-04 MED ORDER — LISDEXAMFETAMINE DIMESYLATE 30 MG PO CAPS
30.0000 mg | ORAL_CAPSULE | Freq: Every morning | ORAL | 0 refills | Status: AC
  Filled 2023-12-04: qty 30, 30d supply, fill #0

## 2023-12-26 ENCOUNTER — Other Ambulatory Visit (HOSPITAL_BASED_OUTPATIENT_CLINIC_OR_DEPARTMENT_OTHER): Payer: Self-pay

## 2023-12-26 MED ORDER — LISDEXAMFETAMINE DIMESYLATE 30 MG PO CAPS
30.0000 mg | ORAL_CAPSULE | Freq: Every morning | ORAL | 0 refills | Status: AC
  Filled 2024-02-21: qty 30, 30d supply, fill #0

## 2023-12-26 MED ORDER — VALACYCLOVIR HCL 500 MG PO TABS
500.0000 mg | ORAL_TABLET | Freq: Every day | ORAL | 4 refills | Status: AC
  Filled 2023-12-26: qty 90, 90d supply, fill #0
  Filled 2024-03-31: qty 90, 90d supply, fill #1

## 2024-01-15 ENCOUNTER — Other Ambulatory Visit (HOSPITAL_BASED_OUTPATIENT_CLINIC_OR_DEPARTMENT_OTHER): Payer: Self-pay

## 2024-02-21 ENCOUNTER — Other Ambulatory Visit (HOSPITAL_BASED_OUTPATIENT_CLINIC_OR_DEPARTMENT_OTHER): Payer: Self-pay

## 2024-03-24 ENCOUNTER — Ambulatory Visit (HOSPITAL_COMMUNITY)
Admission: EM | Admit: 2024-03-24 | Discharge: 2024-03-24 | Disposition: A | Discharge status: Home / Self Care | Payer: Self-pay | Attending: Family Medicine | Admitting: Family Medicine

## 2024-03-24 ENCOUNTER — Encounter (HOSPITAL_COMMUNITY): Payer: Self-pay

## 2024-03-24 DIAGNOSIS — G8929 Other chronic pain: Secondary | ICD-10-CM

## 2024-03-24 NOTE — Discharge Instructions (Addendum)
 We are unable to complete your employers preferred drug screen at this urgent care.    Please go to [facility] for further evaluation.  Information below: [address]  [address] [place], [place]  [address] [phone]  As discussed, your blood pressure is elevated today.  Please follow-up with your primary care provider for further evaluation and possible initiation of antihypertensive medications.  If you develop any new or worsening symptoms or if her symptoms do not start to improve, please return here or follow-up with your primary care provider.  If your symptoms are severe, please go to the emergency room.

## 2024-03-24 NOTE — ED Provider Notes (Signed)
 " Martin Fitzgerald    Martin Fitzgerald: [id number] Arrival date & time: 03/24/24  1750      History   Chief Complaint Chief Complaint  Patient presents with   Elbow Pain    HPI Martin Fitzgerald is a 53 y.o. male.   This 53 year old male is being seen for 50-month history of right elbow pain.  He reports on Saturday he had numbness in his 3rd and 4th digits temporarily when lifting a coffee cup to his mouth.  He says he has determined that repetitive motions at work have led to his current condition.  He denies any acute injury or trauma.  He denies headache, dizziness, blurry vision.  He denies chest pain, shortness of breath, abdominal pain.  He denies back pain.     Past Medical History:  Diagnosis Date   Dysplastic nevus 10/17/2022   Severe dysplastic. Right lower back.   Dysplastic nevus 10/17/2022   Moderate. Right lower leg   Sleep apnea    Traumatic brain injury (HCC) 03/03/2010    Patient Active Problem List   Diagnosis Date Noted   Hypersomnolence 11/02/2021    Past Surgical History:  Procedure Laterality Date   ANKLE SURGERY  2012   APPENDECTOMY  2011   KNEE CARTILAGE SURGERY  2012       Home Medications    Prior to Admission medications  Medication Sig Start Date End Date Taking? Authorizing Provider  lisdexamfetamine  (VYVANSE ) 30 MG capsule Take 1 capsule (30 mg total) by mouth daily. 12/07/22     lisdexamfetamine  (VYVANSE ) 30 MG capsule Take 1 capsule (30 mg total) by mouth every morning. 10/01/23     lisdexamfetamine  (VYVANSE ) 30 MG capsule Take one capsule (30 mg dose) by mouth every morning. Max Daily Amount: 30 mg 11/02/23     lisdexamfetamine  (VYVANSE ) 30 MG capsule Take 1 capsule (30 mg total) by mouth every morning. 12/04/23     lisdexamfetamine  (VYVANSE ) 30 MG capsule Take 1 capsule (30 mg total) by mouth in the morning. 12/26/23     valACYclovir  (VALTREX ) 500 MG tablet Take 1 tablet (500 mg total) by mouth daily. 03/16/23     valACYclovir  (VALTREX ) 500 MG  tablet Take 1 tablet (500 mg total) by mouth daily. 12/26/23       Family History History reviewed. No pertinent family history.  Social History Social History[1]   Allergies   Patient has no known allergies.   Review of Systems Review of Systems  Constitutional:  Positive for activity change. Negative for appetite change, chills and fever.  Eyes:  Negative for visual disturbance.  Respiratory:  Negative for shortness of breath.   Cardiovascular:  Negative for chest pain.  Musculoskeletal:  Positive for arthralgias. Negative for back pain, joint swelling and neck pain.  Skin:  Negative for color change and rash.  Neurological:  Positive for numbness. Negative for dizziness and headaches.  All other systems reviewed and are negative.    Physical Exam Triage Vital Signs ED Triage Vitals  Encounter Vitals Group     BP 03/24/24 1942 (!) 151/102     Girls Systolic BP Percentile --      Girls Diastolic BP Percentile --      Boys Systolic BP Percentile --      Boys Diastolic BP Percentile --      Pulse Rate 03/24/24 1942 71     Resp 03/24/24 1942 16     Temp 03/24/24 1942 97.9 F (36.6 C)  Temp Source 03/24/24 1942 Oral     SpO2 03/24/24 1942 97 %     Weight --      Height --      Head Circumference --      Peak Flow --      Pain Score 03/24/24 1940 7     Pain Loc --      Pain Education --      Exclude from Growth Chart --    No data found.  Updated Vital Signs BP (!) 151/102 (BP Location: Right Arm)   Pulse 71   Temp 97.9 F (36.6 C) (Oral)   Resp 16   SpO2 97%   Visual Acuity Right Eye Distance:   Left Eye Distance:   Bilateral Distance:    Right Eye Near:   Left Eye Near:    Bilateral Near:     Physical Exam Vitals and nursing note reviewed.  Constitutional:      General: He is not in acute distress.    Appearance: He is well-developed. He is not ill-appearing or toxic-appearing.     Comments: Pleasant male appearing stated age found sitting in  chair in no acute distress.  HENT:     Head: Normocephalic and atraumatic.  Eyes:     Conjunctiva/sclera: Conjunctivae normal.  Cardiovascular:     Rate and Rhythm: Normal rate and regular rhythm.     Heart sounds: Normal heart sounds. No murmur heard. Pulmonary:     Effort: Pulmonary effort is normal. No respiratory distress.     Breath sounds: Normal breath sounds.  Abdominal:     Palpations: Abdomen is soft.     Tenderness: There is no abdominal tenderness.  Musculoskeletal:     Right elbow: No swelling or deformity. Normal range of motion.  Skin:    General: Skin is warm and dry.     Capillary Refill: Capillary refill takes less than 2 seconds.  Neurological:     Mental Status: He is alert.     Motor: No weakness or tremor.  Psychiatric:        Mood and Affect: Mood normal.      UC Treatments / Results  Labs (all labs ordered are listed, but only abnormal results are displayed) Labs Reviewed - No data to display  EKG   Radiology No results found.  Procedures Procedures (including critical care time)  Medications Ordered in UC Medications - No data to display  Initial Impression / Assessment and Plan / UC Course  I have reviewed the triage vital signs and the nursing notes.  Pertinent labs & imaging results that were available during my care of the patient were reviewed by me and considered in my medical decision making (see chart for details).     Vitals and triage reviewed, patient is hemodynamically stable.  His blood pressure is elevated.  He denies history of high blood pressure.  He does not take medications for hypertension.  He has a PCP.  Advised to follow-up with PCP for further evaluation and possible initiation of antihypertensive medications.  He verbalizes understanding.  Presentation consistent with repetitive motion injury.  He is advised to follow-up with [facility].  Contact information provided.  Plan of care,  follow-up care, return precautions given, no questions at this time.  His employer requested an oral drug screen.  We are unable to complete this drug screen at this facility.  Work note provided. Final Clinical Impressions(s) / UC Diagnoses  Final diagnoses:  Elbow pain, chronic, right     Discharge Instructions      We are unable to complete your employers preferred drug screen at this urgent care.    Please go to [facility] for further evaluation.  Information below: [address]  [address] [place], [place]  [address] [phone]  As discussed, your blood pressure is elevated today.  Please follow-up with your primary care provider for further evaluation and possible initiation of antihypertensive medications.  If you develop any new or worsening symptoms or if her symptoms do not start to improve, please return here or follow-up with your primary care provider.  If your symptoms are severe, please go to the emergency room.    ED Prescriptions   None    PDMP not reviewed this encounter.    [1]  Social History Tobacco Use   Smoking status: Former    Current packs/day: 0.00    Average packs/day: 1.0 packs/day    Types: Cigarettes, Pipe, Cigars    Quit date: 12/03/1999    Years since quitting: 24.3   Smokeless tobacco: Never  Substance Use Topics   Alcohol use: Yes    Alcohol/week: 4.0 standard drinks of alcohol    Types: 4 Cans of beer per week   Drug use: No     Lennice Jon BROCKS, FNP 03/24/24 2050  "

## 2024-03-24 NOTE — ED Triage Notes (Signed)
 Patient here today with c/o right elbow pain with ROM for 3 months now. 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 Patient has not seen anyone yet.

## 2024-03-31 ENCOUNTER — Other Ambulatory Visit (HOSPITAL_BASED_OUTPATIENT_CLINIC_OR_DEPARTMENT_OTHER): Payer: Self-pay

## 2024-03-31 ENCOUNTER — Other Ambulatory Visit: Payer: Self-pay

## 2024-04-01 ENCOUNTER — Other Ambulatory Visit (HOSPITAL_COMMUNITY): Payer: Self-pay

## 2024-04-03 ENCOUNTER — Other Ambulatory Visit (HOSPITAL_BASED_OUTPATIENT_CLINIC_OR_DEPARTMENT_OTHER): Payer: Self-pay

## 2024-04-03 MED ORDER — LISDEXAMFETAMINE DIMESYLATE 30 MG PO CAPS
30.0000 mg | ORAL_CAPSULE | Freq: Every morning | ORAL | 0 refills | Status: AC
  Filled 2024-04-03 – 2024-04-22 (×2): qty 30, 30d supply, fill #0

## 2024-04-14 ENCOUNTER — Other Ambulatory Visit (HOSPITAL_BASED_OUTPATIENT_CLINIC_OR_DEPARTMENT_OTHER): Payer: Self-pay

## 2024-04-18 ENCOUNTER — Ambulatory Visit (HOSPITAL_COMMUNITY)
Admission: RE | Admit: 2024-04-18 | Discharge: 2024-04-18 | Disposition: A | Discharge status: Home / Self Care | Payer: Self-pay | Source: Ambulatory Visit | Attending: Family Medicine | Admitting: Family Medicine

## 2024-04-18 ENCOUNTER — Other Ambulatory Visit (HOSPITAL_COMMUNITY): Payer: Self-pay | Admitting: Family Medicine

## 2024-04-18 ENCOUNTER — Other Ambulatory Visit: Payer: Self-pay

## 2024-04-18 DIAGNOSIS — M25521 Pain in right elbow: Secondary | ICD-10-CM | POA: Insufficient documentation

## 2024-04-18 DIAGNOSIS — M79641 Pain in right hand: Secondary | ICD-10-CM

## 2024-04-22 ENCOUNTER — Other Ambulatory Visit (HOSPITAL_BASED_OUTPATIENT_CLINIC_OR_DEPARTMENT_OTHER): Payer: Self-pay
